# Patient Record
Sex: Female | Born: 1980 | Race: White | Hispanic: No | Marital: Married | Smoking: Never smoker
Health system: Southern US, Community
[De-identification: ages and names within clinical notes are randomized; demographics above are authoritative.]

## PROBLEM LIST (undated history)

## (undated) DIAGNOSIS — T839XXA Unspecified complication of genitourinary prosthetic device, implant and graft, initial encounter: Secondary | ICD-10-CM

## (undated) HISTORY — PX: OTHER SURGICAL HISTORY: SHX169

## (undated) HISTORY — PX: TYMPANOSTOMY TUBE PLACEMENT: SHX32

## (undated) HISTORY — DX: Unspecified complication of genitourinary prosthetic device, implant and graft, initial encounter: T83.9XXA

## (undated) HISTORY — PX: TONSILLECTOMY: SUR1361

---

## 2004-09-03 ENCOUNTER — Emergency Department: Payer: Self-pay | Admitting: Unknown Physician Specialty

## 2008-12-27 ENCOUNTER — Ambulatory Visit: Payer: Self-pay | Admitting: Obstetrics & Gynecology

## 2009-05-12 ENCOUNTER — Ambulatory Visit: Payer: Self-pay | Admitting: Internal Medicine

## 2010-02-11 ENCOUNTER — Observation Stay: Payer: Self-pay | Admitting: Obstetrics and Gynecology

## 2010-02-28 ENCOUNTER — Inpatient Hospital Stay (HOSPITAL_COMMUNITY)
Admission: AD | Admit: 2010-02-28 | Discharge: 2010-03-03 | Payer: Self-pay | Source: Home / Self Care | Attending: Obstetrics & Gynecology | Admitting: Obstetrics & Gynecology

## 2010-03-23 ENCOUNTER — Encounter: Payer: Self-pay | Admitting: Family Medicine

## 2010-03-23 ENCOUNTER — Ambulatory Visit
Admission: RE | Admit: 2010-03-23 | Discharge: 2010-03-23 | Payer: Self-pay | Source: Home / Self Care | Attending: Family Medicine | Admitting: Family Medicine

## 2010-03-23 LAB — CONVERTED CEMR LAB: Rapid Strep: NEGATIVE

## 2010-04-08 ENCOUNTER — Ambulatory Visit: Admit: 2010-04-08 | Payer: Self-pay | Admitting: Obstetrics & Gynecology

## 2010-04-15 ENCOUNTER — Ambulatory Visit: Payer: Commercial Managed Care - PPO | Admitting: Obstetrics and Gynecology

## 2010-04-15 ENCOUNTER — Ambulatory Visit: Admit: 2010-04-15 | Payer: Self-pay | Admitting: Obstetrics and Gynecology

## 2010-04-16 NOTE — Assessment & Plan Note (Signed)
Summary: COUGH,SORE THROAT AND SINUS DRAINAGE/EVM   Vital Signs:  Patient Profile:   30 Years Old Female CC:      Sore throat with possible strep Height:     65.5 inches Weight:      160 pounds BMI:     26.32 O2 Sat:      99 % O2 treatment:    Room Air Temp:     97.5 degrees F oral Pulse rate:   92 / minute Pulse rhythm:   regular Resp:     18 per minute BP sitting:   120 / 78  (right arm)  Pt. in pain?   yes    Location:   neck    Intensity:   4    Type:       aching  Vitals Entered By: Levonne Spiller EMT-P (March 23, 2010 2:37 PM)              Is Patient Diabetic? No  Does patient need assistance? Functional Status Self care Ambulation Normal      Current Allergies: No known allergies History of Present Illness Chief Complaint: Sore throat with possible strep History of Present Illness: This patient is presenting today because she's had 2 weeks of sore throat and cough and congestion. She's had a lot of sinus drainage and reports that she has noticed a purulent discharge on her tonsils. She says she's been coughing a lot at night. Her cough seems to be worse at night. She's had a low-grade fever as well. No nausea or vomiting reported. She reports that she's had a frequent runny nose and sore throat. She reports that she has been trying to breast-feed but it has been unsuccessful and so she continues to try but she has not been able to produce enough milk to sustain the incident. She reports that she is going to pump the breast milk and dump it out while she's taking the medications. She reports that her worse symptom has been the cough and congestion that she has been having throughout the day. It seems to get worse at night.  REVIEW OF SYSTEMS Constitutional Symptoms      Denies fever, chills, night sweats, weight loss, weight gain, and fatigue.  Eyes       Denies change in vision, eye pain, eye discharge, glasses, contact lenses, and eye  surgery. Ear/Nose/Throat/Mouth       Complains of frequent runny nose, sinus problems, sore throat, and hoarseness.      Denies hearing loss/aids, change in hearing, ear pain, ear discharge, dizziness, frequent nose bleeds, and tooth pain or bleeding.  Respiratory       Complains of productive cough.      Denies dry cough, wheezing, shortness of breath, asthma, bronchitis, and emphysema/COPD.      Comments: Colored Sputum Cardiovascular       Denies murmurs, chest pain, and tires easily with exhertion.    Gastrointestinal       Denies stomach pain, nausea/vomiting, diarrhea, constipation, blood in bowel movements, and indigestion. Genitourniary       Denies painful urination, blood or discharge from vagina, kidney stones, and loss of urinary control. Neurological       Denies paralysis, seizures, and fainting/blackouts. Musculoskeletal       Denies muscle pain, joint pain, joint stiffness, decreased range of motion, redness, swelling, muscle weakness, and gout.  Skin       Denies bruising, unusual mles/lumps or sores, and hair/skin or nail changes.  Psych       Denies mood changes, temper/anger issues, anxiety/stress, speech problems, depression, and sleep problems.  Past History:  Family History: Last updated: 03/23/2010 healthy per patient  Social History: Last updated: 03/23/2010 the patient reports that she just delivered a child and reports that she does not use tobacco alcohol or recreational drugs.  Past Medical History: Unremarkable  Past Surgical History: eardrum surgery on both the ears and tonsillectomy  Family History: healthy per patient  Social History: the patient reports that she just delivered a child and reports that she does not use tobacco alcohol or recreational drugs. Physical Exam General appearance: well developed, well nourished, no acute distress Head: normocephalic, atraumatic Eyes: conjunctivae and lids normal Pupils: equal, round, reactive to  light Ears: normal, no lesions or deformities Nasal: swollen red turbinates with congestion, thick yellow congestion and drainage Oral/Pharynx: tongue normal, posterior pharynx with erythema and yellow exudate, postnasal drainage Neck: neck supple,  trachea midline, no masses Chest/Lungs: no rales, wheezes, or rhonchi bilateral, breath sounds equal without effort Heart: regular rate and  rhythm, no murmur Abdomen: soft, non-tender without obvious organomegaly Extremities: normal extremities Neurological: grossly intact and non-focal Skin: no obvious rashes or lesions MSE: oriented to time, place, and person Assessment Problems:   New Problems: ACUTE PHARYNGITIS (ICD-462) ACUTE SINUSITIS, UNSPECIFIED (ICD-461.9) ALLERGIC RHINITIS CAUSE UNSPECIFIED (ICD-477.9) SUPPRESSED LACTATION POSTPARTUM COND/COMP (ZOX-096.04)   Patient Education: Patient and/or caregiver instructed in the following: rest, fluids, Ibuprofen prn. The risks, benefits and possible side effects were clearly explained and discussed with the patient.  The patient verbalized clear understanding.  The patient was given instructions to return if symptoms don't improve, worsen or new changes develop.  If it is not during clinic hours and the patient cannot get back to this clinic then the patient was told to seek medical care at an available urgent care or emergency department.  The patient verbalized understanding.   Demonstrates willingness to comply.  Plan New Medications/Changes: AMOXICILLIN 875 MG TABS (AMOXICILLIN) take 1 by mouth two times a day until completed  #20 x 0, 03/23/2010, Clanford Johnson MD GUIATUSS AC 100-10 MG/5ML SYRP (GUAIFENESIN-CODEINE) take 1 teaspoon by mouth every 4-6 hours as needed cough, congestion: don't breastfeed while taking this  #60 mL x 0, 03/23/2010, Clanford Johnson MD  Planning Comments:   Pt is going to pump her milk and dump it out while she is taking these medications.  She said she  wanted to do this to protect the newborn.   Go to the pharmacy and pick up your prescription (s).  It may take up to 30 mins for electronic prescriptions to be delivered to the pharmacy.  Please call if your pharmacy has not received your prescriptions after 30 minutes.     Follow Up: Follow up in 2-3 days if no improvement, Follow up on an as needed basis, Follow up with Primary Physician  The patient and/or caregiver has been counseled thoroughly with regard to medications prescribed including dosage, schedule, interactions, rationale for use, and possible side effects and they verbalize understanding.  Diagnoses and expected course of recovery discussed and will return if not improved as expected or if the condition worsens. Patient and/or caregiver verbalized understanding.  Prescriptions: AMOXICILLIN 875 MG TABS (AMOXICILLIN) take 1 by mouth two times a day until completed  #20 x 0   Entered and Authorized by:   Standley Dakins MD   Signed by:   Standley Dakins MD on 03/23/2010   Method  used:   Electronically to        [facility]. # [phone]* (retail)       [address]       [place], [place]  [address]       Ph: [phone]       Fax: [phone]   RxID:   [id number] GUIATUSS AC 100-10 MG/5ML SYRP (GUAIFENESIN-CODEINE) take 1 teaspoon by mouth every 4-6 hours as needed cough, congestion: don't breastfeed while taking this  #60 mL x 0   Entered and Authorized by:   Standley Dakins MD   Signed by:   Standley Dakins MD on 03/23/2010   Method used:   Print then Give to Patient   RxID:   [id number]   Lab Results    R. Strep:    Neg   Patient Instructions: 1)  Oral Rehydration Solution: drink 1/2 ounce every 15 minutes. If tolerated afert 1 hour, drink 1 ounce every 15 minutes. As you can tolerate, keep adding 1/2 ounce every 15 minutes, up to a total of 2-4 ounces. Contact the office if unable to tolerate oral solution, if you keep vomiting, or you  continue to have signs of dehydration. 2)  Take your antibiotic as prescribed until ALL of it is gone, but stop if you develop a rash or swelling and contact our office as soon as possible. 3)  Do not breastfeed your infant while taking these medications.  Pump and dump the milk so that you can continue to produce milk after you have completed the treatment.  4)  Return or go to the ER if no improvement or symptoms getting worse.   5)  The patient was informed that there is no on-call provider or services available at this clinic during off-hours (when the clinic is closed).  If the patient developed a problem or concern that required immediate attention, the patient was advised to go the the nearest available urgent care or emergency department for medical care.  The patient verbalized understanding.

## 2010-04-18 ENCOUNTER — Encounter: Payer: Self-pay | Admitting: Family Medicine

## 2010-05-25 ENCOUNTER — Other Ambulatory Visit (HOSPITAL_COMMUNITY)
Admission: RE | Admit: 2010-05-25 | Discharge: 2010-05-25 | Disposition: A | Discharge status: Home / Self Care | Payer: Commercial Managed Care - PPO | Source: Ambulatory Visit | Attending: Family Medicine | Admitting: Family Medicine

## 2010-05-25 ENCOUNTER — Other Ambulatory Visit: Payer: Self-pay | Admitting: Family Medicine

## 2010-05-25 ENCOUNTER — Ambulatory Visit (INDEPENDENT_AMBULATORY_CARE_PROVIDER_SITE_OTHER): Payer: Commercial Managed Care - PPO | Admitting: Family Medicine

## 2010-05-25 ENCOUNTER — Encounter: Payer: Self-pay | Admitting: Family Medicine

## 2010-05-25 DIAGNOSIS — Z124 Encounter for screening for malignant neoplasm of cervix: Secondary | ICD-10-CM | POA: Insufficient documentation

## 2010-05-25 DIAGNOSIS — Z1159 Encounter for screening for other viral diseases: Secondary | ICD-10-CM | POA: Insufficient documentation

## 2010-05-25 DIAGNOSIS — IMO0002 Reserved for concepts with insufficient information to code with codable children: Secondary | ICD-10-CM | POA: Insufficient documentation

## 2010-05-25 DIAGNOSIS — O99345 Other mental disorders complicating the puerperium: Secondary | ICD-10-CM

## 2010-05-25 DIAGNOSIS — Z0000 Encounter for general adult medical examination without abnormal findings: Secondary | ICD-10-CM

## 2010-05-25 LAB — CBC
HCT: 39.1 % (ref 36.0–46.0)
MCH: 31.9 pg (ref 26.0–34.0)
MCH: 32 pg (ref 26.0–34.0)
MCHC: 34.6 g/dL (ref 30.0–36.0)
MCHC: 35 g/dL (ref 30.0–36.0)
MCV: 91.4 fL (ref 78.0–100.0)
MCV: 92.2 fL (ref 78.0–100.0)
Platelets: 103 10*3/uL — ABNORMAL LOW (ref 150–400)
RDW: 13.4 % (ref 11.5–15.5)
RDW: 13.5 % (ref 11.5–15.5)
WBC: 15.9 10*3/uL — ABNORMAL HIGH (ref 4.0–10.5)

## 2010-05-29 ENCOUNTER — Encounter: Payer: Self-pay | Admitting: Family Medicine

## 2010-06-02 ENCOUNTER — Ambulatory Visit: Payer: Self-pay | Admitting: Family Medicine

## 2010-06-02 NOTE — Assessment & Plan Note (Signed)
Summary: NEW PATIENT EST - Patient would like well visit and Physical ...   Vital Signs:  Patient profile:   30 year old female Height:      65.5 inches Weight:      168.50 pounds BMI:     27.71 Temp:     98.1 degrees F oral Pulse rate:   80 / minute Pulse rhythm:   regular BP sitting:   110 / 70  (left arm) Cuff size:   regular  Vitals Entered By: Linde Gillis CMA Duncan Dull) (May 25, 2010 10:41 AM) CC: new patient, physicial with pap   History of Present Illness: 30 yo here to establish care and for CPX.  G3P2, 10 weeks post partum. Doing well but for past several weeks does feel herself getting anxious and depressed at times. No thoughts of harming herself or her children. Just feels anxious, going back to work and juggling mother hood along with working full time. Tearful at times, sometimes sad but overall she feels she is in good spirits. No panic attacks but does feel anxious when she thinks about all she has to take care of at home and at work.  Wants IUD. No h/o abnormal pap smears or STDs.  Current Medications (verified): 1)  Prenatal Vitimin .Marland Kitchen.. 1 Tab Per Day 2)  Breast Milk Suppl .... 2 Caps Once Per Day 3)  Ibuprofen 800 Mg Tabs (Ibuprofen) .... As Needed 4)  Guiatuss Ac 100-10 Mg/23ml Syrp (Guaifenesin-Codeine) .... Take 1 Teaspoon By Mouth Every 4-6 Hours As Needed Cough, Congestion: Don't Breastfeed While Taking This 5)  Amoxicillin 875 Mg Tabs (Amoxicillin) .... Take 1 By Mouth Two Times A Day Until Completed 6)  Prozac 20 Mg Caps (Fluoxetine Hcl) .Marland Kitchen.. 1 Cap By Mouth Daily.  Allergies (verified): No Known Drug Allergies  Past History:  Past Surgical History: Last updated: 03/23/2010 eardrum surgery on both the ears and tonsillectomy  Family History: Last updated: 03/23/2010 healthy per patient  Social History: Last updated: 05/25/2010  Married 2 boys CMA  Past Medical History: G3P2  Social History:  Married 2 boys CMA  Review of  Systems      See HPI General:  Denies malaise. Eyes:  Denies blurring. ENT:  Denies difficulty swallowing. CV:  Denies chest pain or discomfort. Resp:  Denies shortness of breath. GI:  Denies abdominal pain and nausea. GU:  Denies abnormal vaginal bleeding and dysuria. MS:  Denies joint pain, joint redness, and joint swelling. Derm:  Denies rash. Neuro:  Denies headaches. Psych:  Complains of anxiety, depression, easily tearful, and irritability; denies easily angered, panic attacks, sense of great danger, suicidal thoughts/plans, thoughts of violence, unusual visions or sounds, and thoughts /plans of harming others. Endo:  Denies cold intolerance and heat intolerance. Heme:  Denies abnormal bruising and bleeding.  Physical Exam  General:  well developed, well nourished, no acute distress Head:  normocephalic, atraumatic, and no abnormalities observed.   Eyes:  vision grossly intact, pupils equal, pupils round, and pupils reactive to light.   Ears:  R ear normal and L ear normal.   Nose:  no external deformity.   Mouth:  good dentition.   Neck:  No deformities, masses, or tenderness noted. Breasts:  No mass, nodules, thickening, tenderness, bulging, retraction, inflamation, nipple discharge or skin changes noted.   Lungs:  Normal respiratory effort, chest expands symmetrically. Lungs are clear to auscultation, no crackles or wheezes. Heart:  Normal rate and regular rhythm. S1 and S2 normal without gallop,  murmur, click, rub or other extra sounds. Abdomen:  Bowel sounds positive,abdomen soft and non-tender without masses, organomegaly or hernias noted. Rectal:  no external abnormalities.   Genitalia:  Pelvic Exam:        External: normal female genitalia without lesions or masses        Vagina: normal without lesions or masses        Cervix: normal without lesions or masses        Adnexa: normal bimanual exam without masses or fullness        Uterus: normal by palpation        Pap  smear: performed Msk:  No deformity or scoliosis noted of thoracic or lumbar spine.   Extremities:  No clubbing, cyanosis, edema, or deformity noted with normal full range of motion of all joints.   Neurologic:  alert & oriented X3 and gait normal.   Skin:  Intact without suspicious lesions or rashes Axillary Nodes:  No palpable lymphadenopathy Psych:  Cognition and judgment appear intact. Alert and cooperative with normal attention span and concentration. No apparent delusions, illusions, hallucinations   Impression & Recommendations:  Problem # 1:  Preventive Health Care (ICD-V70.0) Reviewed preventive care protocols, scheduled due services, and updated immunizations Discussed nutrition, exercise, diet, and healthy lifestyle.  Pap smear today, pt had lab work done through wellness program at work and will bring a copy to [place].  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 Will start Prozac 20 mg daily, follow up in one month.  Complete Medication List: 1)  Prenatal Vitimin  .Marland Kitchen.. 1 tab per day 2)  Breast Milk Suppl  .... 2 caps once per day 3)  Ibuprofen 800 Mg Tabs (Ibuprofen) .... As needed 4)  Guiatuss Ac 100-10 Mg/29ml Syrp (Guaifenesin-codeine) .... Take 1 teaspoon by mouth every 4-6 hours as needed cough, congestion: don't breastfeed while taking this 5)  Amoxicillin 875 Mg Tabs (Amoxicillin) .... Take 1 by mouth two times a day until completed 6)  Prozac 20 Mg Caps (Fluoxetine hcl) .Marland Kitchen.. 1 cap by mouth daily.  Patient Instructions: 1)  Great to meet you. 2)  Please follow up with me in 1 month by phone or appointment to let me know how the medication is working. Prescriptions: PROZAC 20 MG CAPS (FLUOXETINE HCL) 1 cap by mouth daily.  #30 x 1   Entered and Authorized by:   Ruthe Mannan MD   Signed by:   Ruthe Mannan MD on 05/25/2010   Method used:   Print then Give to Patient   RxID:    [id number]    Orders Added: 1)  New Patient 18-39 years [99385] 2)  New Patient Level II [99202]    Prior Medications (reviewed today): None Current Allergies (reviewed today): No known allergies      Past Medical History:    G3P2

## 2010-06-02 NOTE — Letter (Signed)
Summary: Results Follow up Letter  [place] [facility]  [address] [place], [place] [address]   Phone: [phone]  Fax: [phone]    05/29/2010 MRN: [id number]  Evelyn Thomas [address] [place], [place]  [address]  [place]  Dear Ms. Patron,  The following are the results of your recent test(s):  Test         Result    Pap Smear:        Normal __X___  Not Normal _____ Comments: ______________________________________________________ Cholesterol: LDL(Bad cholesterol):         Your goal is less than:         HDL (Good cholesterol):       Your goal is more than: Comments:  ______________________________________________________ Mammogram:        Normal _____  Not Normal _____ Comments:  ___________________________________________________________________ Hemoccult:        Normal _____  Not normal _______ Comments:    _____________________________________________________________________ Other Tests:    We routinely do not discuss normal results over the telephone.  If you desire a copy of the results, or you have any questions about this information we can discuss them at your next office visit.   Sincerely,      Dr. Ruthe Mannan

## 2010-06-05 NOTE — Progress Notes (Signed)
NAME:  Evelyn Thomas, Evelyn Thomas NO.:  [id number]  MEDICAL RECORD NO.:  [id number]           PATIENT TYPE:  A  LOCATION:  [facility]                   FACILITY:  [facility]  PHYSICIAN:  Scheryl Darter, MD       DATE OF BIRTH:  [birth year]  DATE OF SERVICE:                                 CLINIC NOTE  The patient comes today postpartum from a vaginal delivery on March 01, 2010.  The patient has had prenatal care at [facility] in [place], but she needs to switch her care at the end of the pregnancy due to insurance issues.  She presented with rupture of membranes on March 01, 2010.  The patient is a 30 year old white female gravida 3, para 2-0-1-2.  She was about 39 weeks when she presented.  She developed maternal fever prior to delivery.  There was shoulder dystocia which was relieved without any fetal injury.  Infant is doing well now.  Infant was kept in nursery for about a week after delivery.  She is now bottle feeding.  Her husband is going to have a vasectomy.  Her postpartum depression scale is 6.  She has no medical problems.  REVIEW OF SYSTEMS:  Positive for some low back pain.  PHYSICAL EXAMINATION:  GENERAL:  Patient in no acute distress. VITAL SIGNS:  Her weight is 164 pounds, blood pressure 98/68, temperature 98.6. ABDOMEN:  Soft, nontender.  No mass. EXTREMITIES:  Show no swelling. PELVIC:  External genitalia, vagina and cervix appeared normal.  Uterus normal size, nontender.  No mass.  IMPRESSION:  The patient is doing well postpartum after a vaginal delivery.  She discussed at length the course of her labor.  She had some dissatisfaction because of development of fever during labor.  She plans to see the primary care physician at [facility].  I recommended that if she wished to change to another gynecologist, she can go to the center for [employer] at [facility].  She should use backup method of contraception until her husband  has had his vasectomy and that has been confirmed to be effective.  She might expect to get her periods in the next month or two.     Scheryl Darter, MD    JA/MEDQ  D:  04/15/2010  T:  04/16/2010  Job:  [id number]

## 2010-06-08 ENCOUNTER — Telehealth: Payer: Self-pay | Admitting: Family Medicine

## 2010-06-08 MED ORDER — FLUOXETINE HCL 40 MG PO CAPS: 40.0000 mg | ORAL_CAPSULE | Freq: Every day | ORAL | Status: DC

## 2010-06-08 MED ORDER — FLUOXETINE HCL 20 MG PO CAPS: 20.0000 mg | ORAL_CAPSULE | Freq: Every day | ORAL | Status: DC

## 2010-06-08 NOTE — Telephone Encounter (Signed)
Rx called to pharmacy, patient notified. 

## 2010-06-08 NOTE — Telephone Encounter (Signed)
Patient advised as instructed via telephone. 

## 2010-06-08 NOTE — Telephone Encounter (Signed)
Patient has a cyst on one of her ovaries and wants it removed.  She wants to know where Dr. Dayton Martes recommends she go to get it removed.  She wants to go to someone with the [facility] system.  Please advise.

## 2010-06-08 NOTE — Telephone Encounter (Signed)
Patient stated that she has been on Prozac 20mg  now for two weeks and can not tell any difference.  Can she increase the dosage?  She stated that Dr. Dayton Martes mentioned that she could go up on the dosage if the 20mg  doesn't work when she was here at her new patient visit.

## 2010-06-08 NOTE — Telephone Encounter (Addendum)
The women[facility] clinic across the street is very good and within the [facility]. Yes she can increase dose to 40 mg daily.

## 2010-06-08 NOTE — Telephone Encounter (Signed)
Patient request increase from Prozac 20mg , not a refill.  Please advise.

## 2010-06-22 ENCOUNTER — Ambulatory Visit: Payer: Commercial Managed Care - PPO | Admitting: Family Medicine

## 2010-06-23 ENCOUNTER — Ambulatory Visit: Payer: Commercial Managed Care - PPO | Admitting: Family Medicine

## 2010-06-26 ENCOUNTER — Encounter: Payer: Self-pay | Admitting: Family Medicine

## 2010-06-26 ENCOUNTER — Ambulatory Visit: Payer: Commercial Managed Care - PPO | Admitting: Family Medicine

## 2010-06-26 ENCOUNTER — Ambulatory Visit (INDEPENDENT_AMBULATORY_CARE_PROVIDER_SITE_OTHER): Payer: Commercial Managed Care - PPO | Admitting: Family Medicine

## 2010-06-26 VITALS — BP 120/80 | HR 80 | Temp 98.7°F | Ht 65.0 in | Wt 166.1 lb

## 2010-06-26 DIAGNOSIS — Z30430 Encounter for insertion of intrauterine contraceptive device: Secondary | ICD-10-CM

## 2010-06-26 NOTE — Progress Notes (Signed)
30 yo G3P1 here for Mirena IUD insertion.  Had a mirena prior to conceiving her son.  No issues with it.  No side effects. Pt is a non smoker. No h/o STDs.  The PMH, PSH, Social History, Family History, Medications, and allergies have been reviewed in [facility], and have been updated if relevant.  ROS: General: Denies fever, chills, sweats. No significant weight loss. GI: Denies nausea, vomiting, diarrhea, constipation, change in bowel habits, abdominal pain, melena, hematochezia GU: Denies dysuria, hematuria, urinary hesitancy, nocturia, denies STD risk, no concerns about discharge  Physical exam: BP 120/80  Pulse 80  Temp(Src) 98.7 F (37.1 C) (Oral)  Ht 5\' 5"  (1.651 m)  Wt 166 lb 1.9 oz (75.352 kg)  BMI 27.64 kg/m2  LMP 06/18/2009  Gen:  Alert, pleasant female, NAD GU: Normal introitus for age, no external lesions, no vaginal discharge, mucosa pink and moist, no vaginal or cervical lesions, no vaginal atrophy, no friaility or hemorrhage, normal uterus size and position, no adnexal masses or tenderness.  Chaperoned exam.      IUD INSERTION: Patient given informed consent, signed copy in the chart..  Negative pregnancy confirmed .Appropriate time out taken Sterile instruments and technique used. Cervix brought into view with use of speculum and then cleansed three times with a betadine swab. A tenaculum was placed into the anterior lip of the cervix. A uterine sound was introduced into the uterine cavity and then removed.   A mirena IUD was placed into the endometrial cavity, deployed and secured. The applicator was removed. The strings were trimmed to 2 inches.   There were no complications and the patient tolerated the procedure well.   She was able to identify the IUD strings post procedure. She was given handouts for post procedure instructions and information about the IUD including an card with the time of recommended removal.

## 2010-06-30 ENCOUNTER — Ambulatory Visit: Payer: Commercial Managed Care - PPO | Admitting: Family Medicine

## 2010-07-02 ENCOUNTER — Encounter: Payer: Self-pay | Admitting: Family Medicine

## 2010-07-08 ENCOUNTER — Encounter: Payer: Self-pay | Admitting: Family Medicine

## 2010-07-09 ENCOUNTER — Telehealth: Payer: Self-pay | Admitting: *Deleted

## 2010-07-09 NOTE — Telephone Encounter (Signed)
Unfortunately, I do not call in abx without evaluating patients.

## 2010-07-09 NOTE — Telephone Encounter (Signed)
Patient called and stated that she thinks she has a sinus infection, been dealing with the pain and pressure/drainage for about 5 days now.  Wanted to know if Dr. Dayton Martes would call her in a Zpak or other antibiotic to [facility].  She has NKDA.  Please advise.

## 2010-07-09 NOTE — Telephone Encounter (Signed)
Patient advised as instructed via telephone.  She stated that her IUD is in place and she is doing fine.  She did start her cycle today but she doesn't think that it will last long.

## 2010-08-03 ENCOUNTER — Telehealth: Payer: Self-pay | Admitting: *Deleted

## 2010-08-03 DIAGNOSIS — R102 Pelvic and perineal pain: Secondary | ICD-10-CM

## 2010-08-03 NOTE — Telephone Encounter (Signed)
It's not uncommon to still have periods when you first get an IUD. Hopefully, they will disappear with time. In terms of the abdominal pain, we should go ahead and get a pelvic/abdominal ultrasound. Let me know and I will place the order.

## 2010-08-03 NOTE — Telephone Encounter (Signed)
Patient called to let Dr. Dayton Martes know that she still having tenderness in her lower abdomen that she mentioned to Dr. Dayton Martes before.  Also, since getting the Mirena her cycles have been coming a week earlier than usual.  She stated that she did not have a cycle five years ago with the Mirena.  She wanted to know what could be done or what the next step will be as far as her abdominal pain.

## 2010-08-04 ENCOUNTER — Telehealth: Payer: Self-pay | Admitting: *Deleted

## 2010-08-04 NOTE — Telephone Encounter (Signed)
Opened in error

## 2010-08-04 NOTE — Telephone Encounter (Signed)
Patient would like to have pelvic/abdominal ultrasound done.  She prefers appointments on Wednesday afternoons if possible because she gets off work at 1:00.

## 2010-08-06 ENCOUNTER — Other Ambulatory Visit: Payer: Self-pay | Admitting: Family Medicine

## 2010-08-06 DIAGNOSIS — R102 Pelvic and perineal pain: Secondary | ICD-10-CM

## 2010-08-07 NOTE — Telephone Encounter (Signed)
ABD/US and Trans/Pelvic US on 08/19/2010 at 7:15am and 9:15am.MK

## 2010-08-12 ENCOUNTER — Other Ambulatory Visit: Payer: Commercial Managed Care - PPO

## 2010-08-12 ENCOUNTER — Inpatient Hospital Stay: Admission: RE | Admit: 2010-08-12 | Payer: Commercial Managed Care - PPO | Source: Ambulatory Visit

## 2010-08-19 ENCOUNTER — Other Ambulatory Visit: Payer: Self-pay | Admitting: Family Medicine

## 2010-08-19 ENCOUNTER — Other Ambulatory Visit: Payer: Commercial Managed Care - PPO

## 2010-08-19 ENCOUNTER — Other Ambulatory Visit (HOSPITAL_COMMUNITY): Payer: Commercial Managed Care - PPO

## 2010-08-19 ENCOUNTER — Ambulatory Visit (HOSPITAL_COMMUNITY)
Admission: RE | Admit: 2010-08-19 | Discharge: 2010-08-19 | Disposition: A | Discharge status: Home / Self Care | Payer: Commercial Managed Care - PPO | Source: Ambulatory Visit | Attending: Family Medicine | Admitting: Family Medicine

## 2010-08-19 ENCOUNTER — Telehealth: Payer: Self-pay | Admitting: *Deleted

## 2010-08-19 DIAGNOSIS — N83209 Unspecified ovarian cyst, unspecified side: Secondary | ICD-10-CM

## 2010-08-19 DIAGNOSIS — R102 Pelvic and perineal pain: Secondary | ICD-10-CM

## 2010-08-19 DIAGNOSIS — Z975 Presence of (intrauterine) contraceptive device: Secondary | ICD-10-CM | POA: Insufficient documentation

## 2010-08-19 DIAGNOSIS — R109 Unspecified abdominal pain: Secondary | ICD-10-CM | POA: Insufficient documentation

## 2010-08-19 NOTE — Telephone Encounter (Signed)
Patient advised as instructed via telephone.  She wants to go ahead and see a specialist regarding the cyst.  She has been seen at [facility] but does not want to go back there.  Please advise.

## 2010-08-19 NOTE — Telephone Encounter (Signed)
Yes i have reviewed and wrote my remarks on result note.

## 2010-08-19 NOTE — Telephone Encounter (Signed)
Ok to just follow the cyst, as typically does not require removal but I will place gyn referral if pt prefers.  Referral placed.

## 2010-08-19 NOTE — Telephone Encounter (Signed)
Pt had pelvic ultrasound this morning and is asking if you can watch out for her results, she would like the results today if possible.

## 2010-08-21 ENCOUNTER — Telehealth: Payer: Self-pay | Admitting: Family Medicine

## 2010-08-21 NOTE — Telephone Encounter (Signed)
I don't know how to cancel and order already entered but it is fine to cancel it.

## 2010-08-21 NOTE — Telephone Encounter (Signed)
Pt is scheduled for a Gyn appointment / Dr. Marice Potter for June 18th at the Ctr for [facility]. Pt does not want you to order the follow up U/S as she will have this done w/ her new Gyn physician. Please cancel order for future pelvis u/s. 08-21-2010

## 2010-09-03 ENCOUNTER — Encounter: Payer: Commercial Managed Care - PPO | Admitting: Obstetrics & Gynecology

## 2010-09-07 ENCOUNTER — Other Ambulatory Visit: Payer: Self-pay | Admitting: *Deleted

## 2010-09-07 MED ORDER — IBUPROFEN 800 MG PO TABS: 800.0000 mg | ORAL_TABLET | ORAL | Status: DC | PRN

## 2010-09-07 NOTE — Telephone Encounter (Signed)
Pharmacy called wanting a quantity that pt can take, per Dr Dayton Martes advised one 3 times a day as needed.

## 2010-09-07 NOTE — Telephone Encounter (Signed)
Patient called to request Rx refill.  She stated that she forgot to ask Dr. Dayton Martes for a refill at her last office visit.  Please advise.  Uses [facility].

## 2010-09-07 NOTE — Telephone Encounter (Signed)
Message left on cell phone voicemail advising patient as instructed.  Rx called to [facility].

## 2010-09-10 ENCOUNTER — Telehealth: Payer: Self-pay | Admitting: *Deleted

## 2010-09-10 ENCOUNTER — Encounter (INDEPENDENT_AMBULATORY_CARE_PROVIDER_SITE_OTHER): Payer: Commercial Managed Care - PPO | Admitting: Obstetrics & Gynecology

## 2010-09-10 DIAGNOSIS — N83209 Unspecified ovarian cyst, unspecified side: Secondary | ICD-10-CM

## 2010-09-10 DIAGNOSIS — N949 Unspecified condition associated with female genital organs and menstrual cycle: Secondary | ICD-10-CM

## 2010-09-10 NOTE — Telephone Encounter (Signed)
I am sorry that she had an unpleasant experience. I will place another referral.

## 2010-09-10 NOTE — Telephone Encounter (Signed)
Patient advised as instructed via telephone.  She stated that she has spoken with Aram Beecham.

## 2010-09-10 NOTE — Telephone Encounter (Signed)
Patient called to let Dr. Dayton Martes know that she saw Dr. Marice Potter this morning at the Center for [facility] across the street.  She stated that she was very unhappy with her visit there.  She arrived 15 minutes early to check in and no one was at the front desk to check her in.  Finally after someone came to the desk, she waited for another 30-45 minutes before they called her back into a room.  When Dr. Marice Potter came in she felt that she rushed through the visit and didn't really listen to her questions or concerns.  She doesn't want to go back there and wants Dr. Dayton Martes to referral her to someone else.  Please advise.

## 2010-09-11 NOTE — Assessment & Plan Note (Signed)
NAME:  Evelyn Thomas, Evelyn Thomas NO.:  [id number]  MEDICAL RECORD NO.:  [id number]           PATIENT TYPE:  LOCATION:  [facility] at [facility]           FACILITY:  PHYSICIAN:  Allie Bossier, MD        DATE OF BIRTH:  1980/12/31  DATE OF SERVICE:  09/09/2010                                 CLINIC NOTE  Evelyn Thomas is a 30 year old, married, white, G3, P2, A1.  She has a 59- month-old child and an 71-year-old child.  She was seen by Dr. Ruthe Mannan recently for her annual exam.  At that time, she complained of midline pelvic pain.  She says this pain has been present since 2007, is not on a daily basis, it "comes and goes."  She does report some urinary urgency and some increase number of bowel movement since delivery.  She reports some dyspareunia which she attributes to her IUD (I do not necessarily agree with this).  She tells me that she had in 2007 a right ovarian cyst was noted and now she has a left ovarian cyst that measures 4.3 cm, it is definitely a simple cyst and the ultrasound was done recently.  However, she says that this cyst has been present since during her pregnancy.  I do not have access to those records at this moment.  PHYSICAL EXAM:  Her uterus is normal size and shape, anteverted, mobile, nontender.  Adnexa nontender.  She does have a fullness in her left adnexa.  ASSESSMENT AND PLAN: 1. Midline pelvic pain for several years duration and increased     urinary urgency and frequency. 2. Simple cyst on the left ovary with a history of a simple cyst on     the right ovary.  I have explained to Evelyn Thomas that in an     ovulating woman cyst frequently occur, they generally resolve,     however, I will obtain the ultrasound records from during her     pregnancy and if the cyst on the left was present for now many     months, I would offer her a laparoscopy.  Please note, she came in     today with the anticipation of having her ovarian cyst removed.  I     have  also told her that I do not think this is at all a malignancy     and I do not think it is related to her midline pain.  I am     suspicious for interstitial cystitis is a cause of her midline     pain.  Therefore, I am checking urine culture, urinalysis, and I am     recommending a Urology consult.  I am also checking cervical     cultures just to be thorough, although, I doubt their positive.  I     have her back in 2-4 weeks to go over her previous ultrasounds and     schedule a laparoscopic left ovarian cystectomy/cystotomy if she     chooses.     Allie Bossier, MD    MCD/MEDQ  D:  09/10/2010  T:  09/10/2010  Job:  [id number]

## 2010-09-17 ENCOUNTER — Telehealth: Payer: Self-pay | Admitting: *Deleted

## 2010-09-17 NOTE — Telephone Encounter (Signed)
This was already placed last time we talked to her (end of June) so we would need to check with Aram Beecham about the status of referral.

## 2010-09-17 NOTE — Telephone Encounter (Signed)
Sometimes this can happen with IUDs. I am very reassured that we confirmed her IUD is in place on ultrasound. There is not anything else I can do at this point and will have to defer to OBGYN.

## 2010-09-17 NOTE — Telephone Encounter (Signed)
Patient called stating that she has been spotting every since her menstrual cycle went off a few days ago.  She stated that she is a little concerned and wants to know what she should do.  She didn't have a cycle with the last IUD she had but now she states that she has a cycle and it last longer than they did before.  Please advise.

## 2010-09-17 NOTE — Telephone Encounter (Signed)
I will check with Aram Beecham.

## 2010-09-17 NOTE — Telephone Encounter (Signed)
Spoke with patient and advised her as instructed.  She stated that she would like a OBGYN referral.  She has spoken to [place] before and she mentioned a Dr. Michel Santee at [facility].  [place] would like to see someone at [facility] if possibe.  Please advise.

## 2010-09-18 ENCOUNTER — Ambulatory Visit (INDEPENDENT_AMBULATORY_CARE_PROVIDER_SITE_OTHER): Payer: Commercial Managed Care - PPO | Admitting: Gynecology

## 2010-09-18 ENCOUNTER — Telehealth: Payer: Self-pay | Admitting: *Deleted

## 2010-09-18 DIAGNOSIS — N949 Unspecified condition associated with female genital organs and menstrual cycle: Secondary | ICD-10-CM

## 2010-09-18 DIAGNOSIS — K644 Residual hemorrhoidal skin tags: Secondary | ICD-10-CM

## 2010-09-18 DIAGNOSIS — Z30431 Encounter for routine checking of intrauterine contraceptive device: Secondary | ICD-10-CM

## 2010-09-18 NOTE — Telephone Encounter (Signed)
This can happen with IUDs. I'm happy that she is pleased with her care.

## 2010-09-18 NOTE — Telephone Encounter (Signed)
Patient was seen today by GYN and she called to let us know that upon examination the doctor found that her IUD was coming out, her body is rejecting it for some reason and that may be what is causing her pain and/or discomfort.  She has a f/u appt with them in 2 months.  She stated that she was very please with the care she received there today.

## 2010-09-18 NOTE — Telephone Encounter (Signed)
Patient called to let me know that she is still bleeding like she is having another menstrual cycle.  She stated that she is becoming more and more concerned.  She stated that her appt to see the GYN was not until August because she had the appt moved back, but she called today and they are working her in at 3:15 today.  She will call back with an update.

## 2010-09-21 NOTE — Telephone Encounter (Signed)
No at this point I would feel more comfortable with the OBGYN to placing her IUD. Thank you for the update.

## 2010-09-21 NOTE — Telephone Encounter (Signed)
Left message at patients job to have her return call.

## 2010-09-21 NOTE — Telephone Encounter (Signed)
I spoke with patient and she wanted to let Dr. Dayton Martes know that when she say Dr. Katherina Right he found that she has a cyst on her left ovary as well as the cyst she already knew was on the right ovary.  He wants to keep an eye on it for now, he ordered a repeat ultrasound in two months.  She wanted to know if Dr. Dayton Martes could put in another IUD?  She stated that her and her spouse have been discussing him getting a vasectomy but he is unsure right now as to if he is going to get it or not.  Please advise.

## 2010-09-22 ENCOUNTER — Ambulatory Visit: Payer: Commercial Managed Care - PPO | Admitting: Obstetrics & Gynecology

## 2010-09-22 NOTE — Telephone Encounter (Signed)
Left message with coworker to have patient return call. 

## 2010-09-22 NOTE — Telephone Encounter (Signed)
No unfortunately we cannot do that. The IUD was successfully placed, which was verified on ultrasound. One of the risks of placing and IUD is that your body expels it. We cannot cover another one for free.

## 2010-09-22 NOTE — Telephone Encounter (Signed)
Patient advised as instructed via telephone. 

## 2010-09-22 NOTE — Telephone Encounter (Signed)
Patient stated that she had to pay for the IUD that was placed here in our office and since it came out she wanted to know if Dr. Dayton Martes would place another one free of charge.  Please advise.

## 2010-09-24 ENCOUNTER — Ambulatory Visit: Payer: Commercial Managed Care - PPO | Admitting: Gynecology

## 2010-10-01 ENCOUNTER — Telehealth: Payer: Self-pay | Admitting: *Deleted

## 2010-10-01 NOTE — Telephone Encounter (Signed)
Left message with a coworker at patients job to have her return call.

## 2010-10-01 NOTE — Telephone Encounter (Signed)
This would require an office visit 

## 2010-10-01 NOTE — Telephone Encounter (Signed)
Patient called to let Dr. Dayton Martes know that she has been having trouble sleeping and has been for several months now.  She wants Dr. Dayton Martes to prescribe her some medicine that will help her sleep. Please advise.

## 2010-10-01 NOTE — Telephone Encounter (Signed)
Patient advised as instructed via telephone.  She stated that she will try OTC sleep aids first and if that doesn't work she will contact us for an appt.

## 2010-10-09 ENCOUNTER — Telehealth: Payer: Self-pay | Admitting: *Deleted

## 2010-10-09 NOTE — Telephone Encounter (Signed)
SEE CHART NOTE(09/18/10) PT CALLED STATING SHE STILL HAVING CENTRAL PELVIC PAIN. LMP: TODAY  WANTS TO KNOW WHAT HER NEXT STEP SHOULD BE? PLEASE ADVISE.

## 2010-10-09 NOTE — Telephone Encounter (Signed)
CALLED PT TO SCHEDULE ULTRASOUND DUE TO BELOW NOTE,PT TRANSFERRED TO APPOINTMENT DESK.

## 2010-10-09 NOTE — Telephone Encounter (Signed)
I would recommend the patient start with ultrasound to your office visit with me after to discuss and then we'll go from there. My suggestion is probably going to be laparoscopy but we'll wait to see the ultrasound report and my reexamination of the patient

## 2010-10-28 ENCOUNTER — Ambulatory Visit: Payer: Commercial Managed Care - PPO | Admitting: Gynecology

## 2010-12-29 ENCOUNTER — Telehealth: Payer: Self-pay | Admitting: *Deleted

## 2010-12-29 ENCOUNTER — Other Ambulatory Visit: Payer: Self-pay | Admitting: Gynecology

## 2010-12-29 DIAGNOSIS — N83209 Unspecified ovarian cyst, unspecified side: Secondary | ICD-10-CM

## 2010-12-29 NOTE — Telephone Encounter (Signed)
Pt schedule for ultrasound tomorrow and her period has a light flow, pt wanted to know if this was okay to still have ultrasound. Pt informed this was okay.

## 2010-12-30 ENCOUNTER — Encounter: Payer: Self-pay | Admitting: Gynecology

## 2010-12-30 ENCOUNTER — Ambulatory Visit: Payer: Commercial Managed Care - PPO | Admitting: Gynecology

## 2010-12-30 ENCOUNTER — Ambulatory Visit (INDEPENDENT_AMBULATORY_CARE_PROVIDER_SITE_OTHER): Payer: 59 | Admitting: Gynecology

## 2010-12-30 ENCOUNTER — Ambulatory Visit: Payer: Commercial Managed Care - PPO

## 2010-12-30 ENCOUNTER — Ambulatory Visit (INDEPENDENT_AMBULATORY_CARE_PROVIDER_SITE_OTHER): Payer: 59

## 2010-12-30 VITALS — BP 110/68

## 2010-12-30 DIAGNOSIS — N949 Unspecified condition associated with female genital organs and menstrual cycle: Secondary | ICD-10-CM

## 2010-12-30 DIAGNOSIS — R102 Pelvic and perineal pain: Secondary | ICD-10-CM

## 2010-12-30 DIAGNOSIS — N83209 Unspecified ovarian cyst, unspecified side: Secondary | ICD-10-CM

## 2010-12-30 NOTE — Progress Notes (Signed)
Patient presents for ultrasound. She was seen in July when she had increased pelvic pain was found to be extruding her IUD this was removed and she had a 4.3 cm left ovarian cyst at that time. She notes her pelvic discomfort has continued. Although she does not have dyspareunia just a generalized discomfort. No urinary symptoms such as frequency dysuria.  Her ultrasound today is normal with right and left ovaries showing physiologic changes. Endometrial echo 8.0 millimeter. Cul-de-sac negative.  Exam Abdomen with small umbilical hernia otherwise soft nontender without masses guarding rebound organomegaly  Assessment and plan: Pelvic discomfort ultrasound is normal. I reviewed possibilities to include endometriosis. She is not having dyspareunia. She's having regular menses. Other possibilities include adhesive disease possible I.C. Although she is not having overt urinary symptoms. Options for management were reviewed.  I think the next step from a gynecologic standpoint would be laparoscopy. Alternative would be to monitor symptoms over the next several months to see if it's still persists. I reviewed what was involved in general with laparoscopy. I discussed with her umbilical hernia that this may complicate things and worsen the hernia. She could also consider having the hernia repaired at the same time as laparoscopy. She has an appointment with her general surgeon beginning of November to follow up in the hernia and she can discuss this with them and then follow up with me with her decision.

## 2010-12-31 ENCOUNTER — Telehealth: Payer: Self-pay | Admitting: *Deleted

## 2010-12-31 NOTE — Telephone Encounter (Signed)
(  From last office visit note) Pt had her appointment with Dr.sankar today regarding her hernia and per pt Dr.Sankar did agree with what you discussed with the pt. But his office is not part of [place], so pt would like another general surgeon to consult with. That way the pt can have two doctors under the [facility] to do her surgery. Please advise

## 2010-12-31 NOTE — Telephone Encounter (Signed)
Pt will be contacted and appointment will be sent up by [facility].

## 2010-12-31 NOTE — Telephone Encounter (Signed)
We can set up an appointment for her to see a general surgeon in reference to umbilical hernia repair at same time as diagnostic laparoscopy. She will see them first and then if she wants to proceed with surgery Olegario Messier will schedule him coordinate a time with them.

## 2011-01-04 ENCOUNTER — Telehealth: Payer: Self-pay

## 2011-01-04 NOTE — Telephone Encounter (Signed)
I left message for patient that I scheduled her an appointment with Dr. Magnus Ivan at [facility]. His first available was Monday, Nov 5 at 8:50am for check-in.  They asked that she bring notes or arrange to have them sent to them from Dr. Evette Cristal who diagnosed her hernia.  I asked patient to call me to confirm that she got this message. ka

## 2011-01-07 ENCOUNTER — Telehealth: Payer: Self-pay

## 2011-01-07 NOTE — Telephone Encounter (Signed)
Patient called to let me know that surgery around Thanksgiving is not going to work out for her.  She is now considering Friday, Dec 21st as a possibility since she is off the following week.  She called [facility] and Dr. Magnus Ivan is not available that day for surgery but Dr. Michaell Cowing is. She r/s her appt. for 01/27/11 and will see Dr. Michaell Cowing then and get his opinion regarding surgery for her hernia.  We will go from there and hopefully coordinate surgery for this day.  I went ahead and called the OR at [facility] and tentatively scheduled her for 7:30am to hold the OR time on the 21st in hopes Dr. Michaell Cowing will be able to work then.  If not perhaps a 1:00pm time might be available and work for him.  At patient's request I checked her ins benefits with [facility] and quoted her surgery prepymt amount so that she can plan financially as well.  She will follow up with me after her visit to the surgeon and we will let Dr. Velvet Bathe know her decision regarding surgery.

## 2011-01-11 ENCOUNTER — Telehealth: Payer: Self-pay

## 2011-01-11 NOTE — Telephone Encounter (Signed)
Patient called to ask me how long she would need to be out of work for [employer] and hernia surgery.  I told her that the Diag Lap would require a few days recovery but Dr. Velvet Bathe would probably support a week out of work with disability co.  I told her I did not know how much time the general surgeon would recommend for the hernia surgery.  Patient began to tell me that she has had a feeling after urination of "throbbing" feeling mid-abdomen and wondered if something is going on with her bladder.  I offered her an office visit to have u/a, exam.  She said to let her consider it and she will call back if she wants to come in.

## 2011-01-18 ENCOUNTER — Ambulatory Visit (INDEPENDENT_AMBULATORY_CARE_PROVIDER_SITE_OTHER): Payer: 59 | Admitting: Surgery

## 2011-01-19 ENCOUNTER — Telehealth: Payer: Self-pay | Admitting: *Deleted

## 2011-01-19 NOTE — Telephone Encounter (Signed)
Pt called stating today her period started early with cramping and some right pelvic pain. She also noted that of some bloody discharge after sex, but it went away. I told pt with all the irregular things going on she should make appointment, pt states she will monitor for now and call back to make office visit if this should continue.

## 2011-01-20 ENCOUNTER — Telehealth: Payer: Self-pay | Admitting: *Deleted

## 2011-01-20 NOTE — Telephone Encounter (Signed)
Pt informed with the below note and will watch for now and follow up if needed.

## 2011-01-20 NOTE — Telephone Encounter (Signed)
If she does not want to proceed with surgery that is her choice. As far as the pain and bleeding I would watch it for now if it persists or recur she needs office visit.

## 2011-01-20 NOTE — Telephone Encounter (Signed)
Pt called on 01/15/11 and said she has decided she cannot do surgery now.  She had me tentatively holding a date until she could check with surgeon.  Because of cost and the holidays she has decided not to do it now.  ka

## 2011-01-20 NOTE — Telephone Encounter (Signed)
Pt called stating her period has started 10 days earlier than usual. (Last office visit on 12/30/10) The bleeding started yesterday not a very heavy flow, but mild. 1 week prior to this she has some bleeding after intercourse. She has cramping and sharp pain at times throbbing right and left pelvic pain. I offered office visit to pt, but she would like you to decide what she should do. Please advise.

## 2011-01-27 ENCOUNTER — Ambulatory Visit (INDEPENDENT_AMBULATORY_CARE_PROVIDER_SITE_OTHER): Payer: 59 | Admitting: Surgery

## 2011-03-02 ENCOUNTER — Other Ambulatory Visit: Payer: Self-pay | Admitting: *Deleted

## 2011-03-02 MED ORDER — IBUPROFEN 800 MG PO TABS: 800.0000 mg | ORAL_TABLET | ORAL | Status: DC | PRN

## 2011-03-02 NOTE — Telephone Encounter (Signed)
Patient advised via telephone, Rx was sent to [facility].

## 2011-03-05 ENCOUNTER — Encounter (HOSPITAL_BASED_OUTPATIENT_CLINIC_OR_DEPARTMENT_OTHER): Admission: RE | Payer: Self-pay | Source: Ambulatory Visit

## 2011-03-05 ENCOUNTER — Ambulatory Visit (HOSPITAL_BASED_OUTPATIENT_CLINIC_OR_DEPARTMENT_OTHER): Admission: RE | Admit: 2011-03-05 | Payer: 59 | Source: Ambulatory Visit | Admitting: Gynecology

## 2011-03-05 SURGERY — LAPAROSCOPY, DIAGNOSTIC
Anesthesia: General

## 2011-03-31 ENCOUNTER — Encounter: Payer: Self-pay | Admitting: Family Medicine

## 2011-03-31 ENCOUNTER — Ambulatory Visit (INDEPENDENT_AMBULATORY_CARE_PROVIDER_SITE_OTHER): Payer: 59 | Admitting: Family Medicine

## 2011-03-31 VITALS — BP 110/80 | HR 76 | Temp 98.6°F

## 2011-03-31 DIAGNOSIS — J069 Acute upper respiratory infection, unspecified: Secondary | ICD-10-CM

## 2011-03-31 DIAGNOSIS — J329 Chronic sinusitis, unspecified: Secondary | ICD-10-CM

## 2011-03-31 MED ORDER — AZITHROMYCIN 250 MG PO TABS: ORAL_TABLET | ORAL | Status: AC

## 2011-03-31 MED ORDER — HYDROCOD POLST-CHLORPHEN POLST 10-8 MG/5ML PO LQCR: 5.0000 mL | Freq: Every evening | ORAL | Status: DC | PRN

## 2011-03-31 NOTE — Patient Instructions (Signed)
Take antibiotic as directed.  Drink lots of fluids.  Treat sympotmatically with Mucinex, nasal saline irrigation, and Tylenol/Ibuprofen. Also try claritin D or zyrtec D over the counter- two times a day as needed ( have to sign for them at pharmacy). You can use warm compresses.  Cough suppressant at night. Call if not improving as expected in 5-7 days.    

## 2011-03-31 NOTE — Progress Notes (Signed)
SUBJECTIVE:  Evelyn Thomas is a 31 y.o. female who complains of coryza, congestion, sore throat and bilateral sinus pain for 12 days. She denies a history of anorexia, chest pain and chills and admits to a history of asthma. Patient denies smoke cigarettes.   Patient Active Problem List  Diagnoses  . POSTPARTUM DEPRESSION, MILD   No past medical history on file. Past Surgical History  Procedure Date  . Tonsillectomy   . Eardrum surgery   . Tympanostomy tube placement    History  Substance Use Topics  . Smoking status: Never Smoker   . Smokeless tobacco: Not on file  . Alcohol Use: No   No family history on file. No Known Allergies Current Outpatient Prescriptions on File Prior to Visit  Medication Sig Dispense Refill  . FLUoxetine (PROZAC) 40 MG capsule Take 1 capsule (40 mg total) by mouth daily.  30 capsule  2  . ibuprofen (ADVIL,MOTRIN) 800 MG tablet Take 1 tablet (800 mg total) by mouth as needed.  60 tablet  0  . PRENATAL VIT W/ FE BISG-FA PO Take by mouth daily.         The PMH, PSH, Social History, Family History, Medications, and allergies have been reviewed in [facility], and have been updated if relevant.  OBJECTIVE: She appears well, vital signs are as noted. Ears normal.  Throat and pharynx normal.  Neck supple. No adenopathy in the neck. Nose is congested. Sinuses non tender. The chest is clear, without wheezes or rales.  ASSESSMENT:  sinusitis  PLAN: Given duration and progression of symptoms, will treat for bacterial sinusitis- asks for Zpack since she typically gets bronchitis but lungs clear today. Symptomatic therapy suggested: push fluids, rest and return office visit prn if symptoms persist or worsen.  Call or return to clinic prn if these symptoms worsen or fail to improve as anticipated.

## 2011-07-23 ENCOUNTER — Other Ambulatory Visit: Payer: 59

## 2011-07-30 ENCOUNTER — Ambulatory Visit (INDEPENDENT_AMBULATORY_CARE_PROVIDER_SITE_OTHER): Payer: 59 | Admitting: Family Medicine

## 2011-07-30 ENCOUNTER — Encounter: Payer: Self-pay | Admitting: Family Medicine

## 2011-07-30 VITALS — BP 100/70 | HR 72 | Temp 98.1°F | Ht 65.0 in | Wt 165.0 lb

## 2011-07-30 DIAGNOSIS — Z0000 Encounter for general adult medical examination without abnormal findings: Secondary | ICD-10-CM

## 2011-07-30 DIAGNOSIS — Z136 Encounter for screening for cardiovascular disorders: Secondary | ICD-10-CM

## 2011-07-30 LAB — LIPID PANEL
HDL: 48 mg/dL (ref >39)
Total CHOL/HDL Ratio: 4.6 Ratio
VLDL: 26 mg/dL (ref 0–40)

## 2011-07-30 LAB — COMPREHENSIVE METABOLIC PANEL
ALT: 11 U/L (ref 0–35)
AST: 16 U/L (ref 0–37)
Alkaline Phosphatase: 53 U/L (ref 39–117)
BUN: 10 mg/dL (ref 6–23)
Creat: 0.85 mg/dL (ref 0.50–1.10)
Total Bilirubin: 0.9 mg/dL (ref 0.3–1.2)

## 2011-07-30 NOTE — Progress Notes (Signed)
30 yo G2P2 here for CPX.  Pap smear normal last year.  Pelvic pain- chronic issue. Was scheduled for ex lap in 01/2011 but cancelled due to issues with her benefits. Dr. Audie Box was supposed to perform it. Also has an umbilical hernia which was scheduled to be repaired at same time by another surgeon during her ex lap per pt.  No longer has IUD- had some bleeding several months after it was inserted and per pt, fell out at [employer] office (expelled).  Overall feels good.  Husband is considering a vasectomy.  Patient Active Problem List  Diagnoses  . POSTPARTUM DEPRESSION, MILD  . Routine general medical examination at a health care facility   No past medical history on file. Past Surgical History  Procedure Date  . Tonsillectomy   . Eardrum surgery   . Tympanostomy tube placement    History  Substance Use Topics  . Smoking status: Never Smoker   . Smokeless tobacco: Not on file  . Alcohol Use: No   No family history on file. No Known Allergies Current Outpatient Prescriptions on File Prior to Visit  Medication Sig Dispense Refill  . ibuprofen (ADVIL,MOTRIN) 800 MG tablet Take 1 tablet (800 mg total) by mouth as needed.  60 tablet  0   The PMH, PSH, Social History, Family History, Medications, and allergies have been reviewed in [facility], and have been updated if relevant.  ROS:  See HPI Patient reports no  vision/ hearing changes,anorexia, weight change, fever ,adenopathy, persistant / recurrent hoarseness, swallowing issues, chest pain, edema,persistant / recurrent cough, hemoptysis, dyspnea(rest, exertional, paroxysmal nocturnal), gastrointestinal  bleeding (melena, rectal bleeding), abdominal pain, excessive heart burn, GU symptoms(dysuria, hematuria, pyuria, voiding/incontinence  Issues) syncope, focal weakness, severe memory loss, concerning skin lesions, depression, anxiety, abnormal bruising/bleeding, major joint swelling, breast masses or abnormal vaginal bleeding.     Physical exam: BP 100/70  Pulse 72  Temp(Src) 98.1 F (36.7 C) (Oral)  Ht 5\' 5"  (1.651 m)  Wt 165 lb (74.844 kg)  BMI 27.46 kg/m2  LMP 07/17/2011  General:  Well-developed,well-nourished,in no acute distress; alert,appropriate and cooperative throughout examination Head:  normocephalic and atraumatic.   Eyes:  vision grossly intact, pupils equal, pupils round, and pupils reactive to light.   Ears:  R ear normal and L ear normal.   Nose:  no external deformity.   Mouth:  good dentition.   Neck:  No deformities, masses, or tenderness noted. Breasts:  No mass, nodules, thickening, tenderness, bulging, retraction, inflamation, nipple discharge or skin changes noted.   Lungs:  Normal respiratory effort, chest expands symmetrically. Lungs are clear to auscultation, no crackles or wheezes. Heart:  Normal rate and regular rhythm. S1 and S2 normal without gallop, murmur, click, rub or other extra sounds. Rectal:  no external abnormalities.   Msk:  No deformity or scoliosis noted of thoracic or lumbar spine.   Extremities:  No clubbing, cyanosis, edema, or deformity noted with normal full range of motion of all joints.   Neurologic:  alert & oriented X3 and gait normal.   Skin:  Intact without suspicious lesions or rashes Cervical Nodes:  No lymphadenopathy noted Axillary Nodes:  No palpable lymphadenopathy Psych:  Cognition and judgment appear intact. Alert and cooperative with normal attention span and concentration. No apparent delusions, illusions, hallucinations   Assessment and Plan: 1. Routine general medical examination at a health care facility  Reviewed preventive care protocols, scheduled due services, and updated immunizations Discussed nutrition, exercise, diet, and healthy lifestyle.  Comprehensive metabolic panel  2. Screening for ischemic heart disease  Lipid Panel

## 2011-07-30 NOTE — Patient Instructions (Signed)
Great to see you. Talk with Dr. Audie Box about an IUD or implanon. We will call you with your lab results.

## 2011-08-25 ENCOUNTER — Telehealth: Payer: Self-pay | Admitting: Family Medicine

## 2011-08-25 NOTE — Telephone Encounter (Signed)
Pt is calling concerning an IUD. She was wanting an IUD put in and she does remember you saying that Lowella Bandy was the only one trained to help put those in. She checked with her insurance to see if it was covered and it is covered 100 percent if it was to be done here in the office but it's not covered if she has it done at her OBGYN office. She was hoping that she would be able to have this done here.

## 2011-08-25 NOTE — Telephone Encounter (Signed)
Unfortunately, I am still not doing them here yet.

## 2011-12-03 ENCOUNTER — Other Ambulatory Visit: Payer: Self-pay

## 2011-12-03 MED ORDER — IBUPROFEN 800 MG PO TABS: 800.0000 mg | ORAL_TABLET | ORAL | Status: DC | PRN

## 2011-12-03 NOTE — Telephone Encounter (Signed)
Left v/m for pt to ck with pharmacy med sent in.

## 2011-12-03 NOTE — Telephone Encounter (Signed)
Pt had dental work this morning; pt thought she had Ibuprofen 800 mg at home. Pt is out of med and dentist office is closed.pt request refill Ibuprofen 800 mg to [facility].Please advise.

## 2012-02-23 ENCOUNTER — Other Ambulatory Visit: Payer: Self-pay

## 2012-02-23 ENCOUNTER — Encounter: Payer: Self-pay | Admitting: Family Medicine

## 2012-02-23 ENCOUNTER — Ambulatory Visit (INDEPENDENT_AMBULATORY_CARE_PROVIDER_SITE_OTHER): Payer: 59 | Admitting: Family Medicine

## 2012-02-23 VITALS — BP 100/60 | HR 88 | Temp 98.3°F | Wt 145.0 lb

## 2012-02-23 DIAGNOSIS — J329 Chronic sinusitis, unspecified: Secondary | ICD-10-CM

## 2012-02-23 MED ORDER — AMOXICILLIN 875 MG PO TABS: 875.0000 mg | ORAL_TABLET | Freq: Two times a day (BID) | ORAL | Status: DC

## 2012-02-23 MED ORDER — AMOXICILLIN-POT CLAVULANATE 875-125 MG PO TABS: 1.0000 | ORAL_TABLET | Freq: Two times a day (BID) | ORAL | Status: DC

## 2012-02-23 MED ORDER — IBUPROFEN 800 MG PO TABS: 800.0000 mg | ORAL_TABLET | ORAL | Status: DC | PRN

## 2012-02-23 NOTE — Patient Instructions (Addendum)
Take augmentin as directed- 1 tablet twice daily for 10 days.  Drink lots of fluids.  Treat sympotmatically with Mucinex, nasal saline irrigation, and Tylenol/Ibuprofen. Also try claritin D or zyrtec D over the counter- two times a day as needed ( have to sign for them at pharmacy). You can use warm compresses.  Cough suppressant at night. Call if not improving as expected in 5-7 days.

## 2012-02-23 NOTE — Telephone Encounter (Signed)
Amoxicillin and ibuprofen rx sent

## 2012-02-23 NOTE — Progress Notes (Signed)
SUBJECTIVE:  Evelyn Thomas is a 31 y.o. female who complains of coryza, congestion, sneezing, dry cough and bilateral sinus pain for 10 days. She denies a history of anorexia, chest pain, fatigue, fevers, myalgias, nausea, shortness of breath, sweats, vomiting, weakness and weight loss and denies a history of asthma. Patient denies smoke cigarettes.   Patient Active Problem List  Diagnosis  . POSTPARTUM DEPRESSION, MILD  . Routine general medical examination at a health care facility   No past medical history on file. Past Surgical History  Procedure Date  . Tonsillectomy   . Eardrum surgery   . Tympanostomy tube placement    History  Substance Use Topics  . Smoking status: Never Smoker   . Smokeless tobacco: Not on file  . Alcohol Use: No   No family history on file. No Known Allergies Current Outpatient Prescriptions on File Prior to Visit  Medication Sig Dispense Refill  . ibuprofen (ADVIL,MOTRIN) 800 MG tablet Take 1 tablet (800 mg total) by mouth as needed.  60 tablet  0   The PMH, PSH, Social History, Family History, Medications, and allergies have been reviewed in [facility], and have been updated if relevant.  OBJECTIVE: BP 100/60  Pulse 88  Temp 98.3 F (36.8 C)  Wt 145 lb (65.772 kg)  She appears well, vital signs are as noted. Ears normal.  Throat and pharynx normal.  Neck supple. No adenopathy in the neck. Nose is congested. Sinuses  Tender to palpation throughout. The chest is clear, without wheezes or rales.  ASSESSMENT:  sinusitis  PLAN: Given duration and progression of symptoms, will treat for bacterial sinusitis. Symptomatic therapy suggested: push fluids, rest and return office visit prn if symptoms persist or worsen.  Call or return to clinic prn if these symptoms worsen or fail to improve as anticipated.

## 2012-02-23 NOTE — Telephone Encounter (Signed)
Pt is at [employer] and augmentin co pay is $15.00; amoxicillin co pay is $4.00. Pt request antibiotic changed to Amoxicillin; pt did not pick up augmentin.Please advise.

## 2012-02-23 NOTE — Telephone Encounter (Signed)
Pt left note requesting refill on ibuprofen 800 mg to [facility].Please advise.

## 2012-02-28 NOTE — Telephone Encounter (Signed)
Pt called back to see why did not get a call back about med refills. I  Spoke with Darlena at [facility] and she said pt had already picked up Augmentin and got Ibuprofen as well. I spoke with pt and she did pick up Augmentin but has not gotten Ibuprofen yet. Pt said she thought all meds for antibiotics and things like Ibuprofen  at [facility] were $ 4.00. I advised thought there was a list of meds [facility] recognized at a price of $ 4.00. Pt will ck with [facility] about $4.00 charge and to pick up Ibuprofen.

## 2012-08-10 ENCOUNTER — Ambulatory Visit (INDEPENDENT_AMBULATORY_CARE_PROVIDER_SITE_OTHER): Payer: BC Managed Care – PPO | Admitting: Gynecology

## 2012-08-10 ENCOUNTER — Other Ambulatory Visit (HOSPITAL_COMMUNITY)
Admission: RE | Admit: 2012-08-10 | Discharge: 2012-08-10 | Disposition: A | Discharge status: Home / Self Care | Payer: Self-pay | Source: Ambulatory Visit | Attending: Gynecology | Admitting: Gynecology

## 2012-08-10 ENCOUNTER — Encounter: Payer: Self-pay | Admitting: Gynecology

## 2012-08-10 VITALS — BP 110/60 | Ht 65.0 in | Wt 144.0 lb

## 2012-08-10 DIAGNOSIS — Z01419 Encounter for gynecological examination (general) (routine) without abnormal findings: Secondary | ICD-10-CM

## 2012-08-10 DIAGNOSIS — Z309 Encounter for contraceptive management, unspecified: Secondary | ICD-10-CM

## 2012-08-10 DIAGNOSIS — Z13220 Encounter for screening for lipoid disorders: Secondary | ICD-10-CM

## 2012-08-10 DIAGNOSIS — Z1151 Encounter for screening for human papillomavirus (HPV): Secondary | ICD-10-CM | POA: Insufficient documentation

## 2012-08-10 DIAGNOSIS — Z131 Encounter for screening for diabetes mellitus: Secondary | ICD-10-CM

## 2012-08-10 LAB — CBC WITH DIFFERENTIAL/PLATELET
Basophils Absolute: 0 10*3/uL (ref 0.0–0.1)
Eosinophils Absolute: 0.3 10*3/uL (ref 0.0–0.7)
Eosinophils Relative: 5 % (ref 0–5)
Lymphocytes Relative: 37 % (ref 12–46)
Lymphs Abs: 2 10*3/uL (ref 0.7–4.0)
Neutrophils Relative %: 51 % (ref 43–77)
Platelets: 223 10*3/uL (ref 150–400)
RBC: 4.63 MIL/uL (ref 3.87–5.11)
RDW: 12.6 % (ref 11.5–15.5)
WBC: 5.4 10*3/uL (ref 4.0–10.5)

## 2012-08-10 LAB — LIPID PANEL
HDL: 49 mg/dL (ref >39)
LDL Cholesterol: 110 mg/dL — ABNORMAL HIGH (ref 0–99)
Triglycerides: 109 mg/dL (ref <150)

## 2012-08-10 LAB — GLUCOSE, RANDOM: Glucose, Bld: 80 mg/dL (ref 70–99)

## 2012-08-10 NOTE — Addendum Note (Signed)
Addended by: Dayna Barker on: 08/10/2012 04:23 PM   Modules accepted: Orders

## 2012-08-10 NOTE — Progress Notes (Signed)
JULYANA WOOLVERTON 1980/05/23 [id number]        32 y.o.  [id number] for annual exam.  Several issues noted below.  Past medical history,surgical history, medications, allergies, family history and social history were all reviewed and documented in the EPIC chart. ROS:  Was performed and pertinent positives and negatives are included in the history.  Exam: Kim assistant Filed Vitals:   08/10/12 1535  BP: 110/60  Height: 5\' 5"  (1.651 m)  Weight: 144 lb (65.318 kg)   General appearance  Normal Skin grossly normal Head/Neck normal with no cervical or supraclavicular adenopathy thyroid normal Lungs  clear Cardiac RR, without RMG Abdominal  soft, nontender, without masses, organomegaly or hernia Breasts  examined lying and sitting without masses, retractions, discharge or axillary adenopathy. Pelvic  Ext/BUS/vagina  normal   Cervix  normal Pap/HPV  Uterus  anteverted, normal size, shape and contour, midline and mobile nontender   Adnexa  Without masses or tenderness    Anus and perineum  normal   Rectovaginal  normal sphincter tone without palpated masses or tenderness.    Assessment/Plan:  32 y.o. [id number] female for annual exam, regular menses, condom birth control.   1. Contraceptive management. Patient using condoms and I reviewed the failure risk with these. Had IUD but expelled it. Had been planning vasectomy but ultimately decided against this. We discussed contraceptive options and patient's wanting to try the Mirena IUD again. She had one for 5 years and did well up until the very end. Possible slight increased risk for expulsion discussed. I gave her literature on this and she is going to followup with her next menses for IUD placement. 2. Pap smear 2012. Pap/HPV today. No history of abnormal Pap smears previously. Assuming negative and plan less frequent screening interval. 3. Breast health. SBE monthly reviewed. Plan mammogram closer to 39. 4. Health maintenance. Baseline CBC glucose  lipid profile urinalysis ordered. Followup for IUD placement otherwise annually.    Dara Lords MD, 4:18 PM 08/10/2012

## 2012-08-10 NOTE — Patient Instructions (Signed)
Follow up in one year for annual exam. Follow up for IUD if you choose.

## 2012-08-11 ENCOUNTER — Other Ambulatory Visit: Payer: Self-pay | Admitting: Gynecology

## 2012-08-11 DIAGNOSIS — Z3049 Encounter for surveillance of other contraceptives: Secondary | ICD-10-CM

## 2012-08-11 LAB — URINALYSIS W MICROSCOPIC + REFLEX CULTURE
Casts: NONE SEEN
Crystals: NONE SEEN
Glucose, UA: NEGATIVE mg/dL
Nitrite: NEGATIVE
Specific Gravity, Urine: 1.025 (ref 1.005–1.030)
pH: 6 (ref 5.0–8.0)

## 2012-08-11 MED ORDER — LEVONORGESTREL 20 MCG/24HR IU IUD: INTRAUTERINE_SYSTEM | Freq: Once | INTRAUTERINE | Status: AC

## 2012-08-12 LAB — URINE CULTURE
Colony Count: NO GROWTH
Organism ID, Bacteria: NO GROWTH

## 2012-08-31 ENCOUNTER — Ambulatory Visit (INDEPENDENT_AMBULATORY_CARE_PROVIDER_SITE_OTHER): Payer: BC Managed Care – PPO | Admitting: Gynecology

## 2012-08-31 ENCOUNTER — Encounter: Payer: Self-pay | Admitting: Gynecology

## 2012-08-31 DIAGNOSIS — Z30431 Encounter for routine checking of intrauterine contraceptive device: Secondary | ICD-10-CM

## 2012-08-31 NOTE — Patient Instructions (Signed)
Intrauterine Device Insertion Most often, an intrauterine device (IUD) is inserted into the uterus to prevent pregnancy. There are 2 types of IUDs available:  Copper IUD. This type of IUD creates an environment that is not favorable to sperm survival. The mechanism of action of the copper IUD is not known for certain. It can stay in place for 10 years.  Hormone IUD. This type of IUD contains the hormone progestin (synthetic progesterone). The progestin thickens the cervical mucus and prevents sperm from entering the uterus, and it also thins the uterine lining. There is no evidence that the hormone IUD prevents implantation. The hormone IUD can stay in place for up to 5 years. An IUD is the most cost-effective birth control if left in place for the full duration. It may be removed at any time. LET YOUR CAREGIVER KNOW ABOUT:  Sensitivity to metals.  Medicines taken including herbs, eyedrops, over-the-counter medicines, and creams.  Use of steroids (by mouth or creams).  Previous problems with anesthetics or numbing medicine.  Previous gynecological surgery.  History of blood clots or clotting disorders.  Possibility of pregnancy.  Menstrual irregularities.  Concerns regarding unusual vaginal discharge or odors.  Previous experience with an IUD.  Other health problems. RISKS AND COMPLICATIONS  Accidental puncture (perforation) of the uterus.  Accidental placement of the IUD either in the muscle layer of the uterus (myometrium) or outside the uterus. If this happen, the IUD can be found essentially floating around the bowels. When this happens, the IUD must be taken out surgically.  The IUD may fall out of the uterus (expulsion). This is more common in women who have recently had a child.   Pregnancy in the fallopian tube (ectopic). BEFORE THE PROCEDURE  Schedule the IUD insertion for when you will have your menstrual period or right after, to make sure you are not pregnant.  Placement of the IUD is better tolerated shortly after a menstrual cycle.  You may need to take tests or be examined to make sure you are not pregnant.  You may be required to take a pregnancy test.  You may be required to get checked for sexually transmitted infections (STIs) prior to placement. Placing an IUD in someone who has an infection can make an infection worse.  You may be given a pain reliever to take 1 or 2 hours before the procedure.  An exam will be performed to determine the size and position of your uterus.  Ask your caregiver about changing or stopping your regular medicines. PROCEDURE   A tool (speculum) is placed in the vagina. This allows your caregiver to see the lower part of the uterus (cervix).  The cervix is prepped with a medicine that lowers the risk of infection.  You may be given a medicine to numb each side of the cervix (intracervical or paracervical block). This is used to block and control any discomfort with insertion.  A tool (uterine sound) is inserted into the uterus to determine the length of the uterine cavity and the direction the uterus may be tilted.  A slim instrument (IUD inserter) is inserted through the cervical canal and into your uterus.  The IUD is placed in the uterine cavity and the insertion device is removed.  The nylon string that is attached to the IUD, and used for eventual IUD removal, is trimmed. It is trimmed so that it lays high in the vagina, just outside the cervix. AFTER THE PROCEDURE  You may have bleeding after the   procedure. This is normal. It varies from light spotting for a few days to menstrual-like bleeding.  You may have mild cramping.  Practice checking the string coming out of the cervix to make sure the IUD remains in the uterus. If you cannot feel the string, you should schedule a "string check" with your caregiver.  If you had a hormone IUD inserted, expect that your period may be lighter or nonexistent  within a year's time (though this is not always the case). There may be delayed fertility with the hormone IUD as a result of its progesterone effect. When you are ready to become pregnant, it is suggested to have the IUD removed up to 1 year in advance.  Yearly exams are advised. Document Released: 10/28/2010 Document Revised: 05/24/2011 Document Reviewed: 10/28/2010 Exit[employer] Patient Information 2014 [employer].  

## 2012-08-31 NOTE — Progress Notes (Signed)
Patient presents for Mirena IUD placement. She has read through the booklet, has no contraindications and signed the consent form. She currently is on a normal menses.  I reviewed the insertional process with her as well as the risks to include infection either immediate or long-term, uterine perforation or migration requiring surgery to remove, other complications such as pain, hormonal side effects and possibilities of failure with subsequent pregnancy.    Exam with Kim assistant Pelvic: External BUS vagina normal. Cervix normal with menses flow. Uterus anteverted normal size shape contour midline mobile nontender. Adnexa without masses or tenderness.  Procedure: The cervix was cleansed with Betadine, anterior lip grasped with a single-tooth tenaculum, the uterus was sounded and a Mirena IUD was placed according to manufacturer's recommendations without difficulty. The strings were trimmed. The patient tolerated well and will follow up in one month for a postinsertional check.  Lot number:  [id number]

## 2012-09-04 ENCOUNTER — Telehealth: Payer: Self-pay | Admitting: *Deleted

## 2012-09-04 NOTE — Telephone Encounter (Signed)
Not unusual at all. Sometimes patients will cramp on and off for the first month but he should be getting better slowly. As long as everything else is okay i.e. no fevers then I would watch for now.

## 2012-09-04 NOTE — Telephone Encounter (Signed)
Left the below note on pt voicemail. 

## 2012-09-04 NOTE — Telephone Encounter (Signed)
Pt had IUD placed on 08/31/12 is doing well with except for the cramping. Pt does have motrin 800 mg tablets that she is taking for the cramping, she asked if this is normal to be 5 days out from placement and still have cramping? Other wise pt doing well. Please advise

## 2012-09-07 ENCOUNTER — Ambulatory Visit: Payer: BC Managed Care – PPO | Admitting: Gynecology

## 2012-09-12 ENCOUNTER — Ambulatory Visit: Payer: BC Managed Care – PPO | Admitting: Gynecology

## 2012-10-02 ENCOUNTER — Ambulatory Visit (INDEPENDENT_AMBULATORY_CARE_PROVIDER_SITE_OTHER): Payer: BC Managed Care – PPO | Admitting: Gynecology

## 2012-10-02 ENCOUNTER — Encounter: Payer: Self-pay | Admitting: Gynecology

## 2012-10-02 DIAGNOSIS — J322 Chronic ethmoidal sinusitis: Secondary | ICD-10-CM

## 2012-10-02 DIAGNOSIS — Z30431 Encounter for routine checking of intrauterine contraceptive device: Secondary | ICD-10-CM

## 2012-10-02 MED ORDER — AZITHROMYCIN 250 MG PO TABS: ORAL_TABLET | ORAL | Status: DC

## 2012-10-02 NOTE — Patient Instructions (Addendum)
Follow up for ultrasound as scheduled 

## 2012-10-02 NOTE — Progress Notes (Signed)
Patient presents for IUD check.  IUD was placed 19 June and since then has had some spotting on and off otherwise doing well. Does note of the past week URI symptoms for which she has been taking OTC medications. She's had cough, nonproductive with head congestion. Awoke this morning with periorbital discomfort and greenish mucus on blowing her nose. Does have a history of sinusitis in the past.  Exam was [employer] normal with the exception of periorbital tenderness to palpation. No overlying skin erythema, pharyngitis or cervical lymphadenopathy. Lungs clear without wheezing or rhonchi. Cardiac regular rate no rubs murmurs or gallops. Abdomen soft nontender without masses guarding rebound organomegaly. Pelvic external BUS vagina normal. Cervix normal with IUD string not visualized. Uterus normal size, mobile nontender. Adnexa without masses or tenderness.  Colposcopic evaluation with endocervical speculum unable to identify the IUD string noting very redundant endocervical mucosa.  Assessment and plan: 1. IUD. Strength not visualized. Schedule ultrasound for location. 2. Sinusitis. Classic presentation. Will cover with azithromycin 500 mg one day, 250 mg x4 days. Followup if symptoms persist, worsen or recur.

## 2012-10-11 ENCOUNTER — Encounter: Payer: Self-pay | Admitting: Gynecology

## 2012-10-11 ENCOUNTER — Ambulatory Visit (INDEPENDENT_AMBULATORY_CARE_PROVIDER_SITE_OTHER): Payer: BC Managed Care – PPO | Admitting: Gynecology

## 2012-10-11 ENCOUNTER — Ambulatory Visit (INDEPENDENT_AMBULATORY_CARE_PROVIDER_SITE_OTHER): Payer: BC Managed Care – PPO

## 2012-10-11 DIAGNOSIS — N839 Noninflammatory disorder of ovary, fallopian tube and broad ligament, unspecified: Secondary | ICD-10-CM

## 2012-10-11 DIAGNOSIS — Z30431 Encounter for routine checking of intrauterine contraceptive device: Secondary | ICD-10-CM

## 2012-10-11 DIAGNOSIS — N83209 Unspecified ovarian cyst, unspecified side: Secondary | ICD-10-CM

## 2012-10-11 NOTE — Patient Instructions (Signed)
Follow up for ultrasound in 3 months 

## 2012-10-11 NOTE — Progress Notes (Signed)
Patient presents for ultrasound for IUD location.  Ultrasound shows uterus generous in size without evidence of pathology. Endometrial echo 5.9 mm. IUD located within the endometrial canal. Right ovary normal. Left ovary with 2 apparent cysts 31 mm and 33 mm mean. Both echo-free and avascular. Cul-de-sac negative.  Assessment and plan: 1. IUD management. IUD within proper location. Will continue to monitor. 2. Simple avascular left ovarian cysts. 31 and 33 mm mean. Patient asymptomatic. Recommend observation with re\re ultrasound in 3 months. Various scenarios to include resolution persistence or enlargement discussed. Possible surgery if enlarging. Importance of followup stressed.

## 2012-10-25 ENCOUNTER — Telehealth: Payer: Self-pay | Admitting: *Deleted

## 2012-10-25 MED ORDER — MEGESTROL ACETATE 20 MG PO TABS: 20.0000 mg | ORAL_TABLET | Freq: Every day | ORAL | Status: DC

## 2012-10-25 NOTE — Telephone Encounter (Signed)
Pt has IUD placed June 19 pt said for the last 2 months she has been having spotting everyday now. Pt was told to follow up if this was to continue. Please advise

## 2012-10-25 NOTE — Telephone Encounter (Signed)
Pt informed with the below note, rx sent. 

## 2012-10-25 NOTE — Telephone Encounter (Signed)
Recommend trial of Megace 20 mg daily for 10 days. If spotting continues after that let me know and we can try something different.

## 2012-11-03 ENCOUNTER — Telehealth: Payer: Self-pay | Admitting: *Deleted

## 2012-11-03 MED ORDER — IBUPROFEN 800 MG PO TABS: 800.0000 mg | ORAL_TABLET | ORAL | Status: DC | PRN

## 2012-11-03 NOTE — Telephone Encounter (Signed)
rx sent, pt informed.  

## 2012-11-03 NOTE — Telephone Encounter (Signed)
Pt called to see if you would be willing to fill her motrin 800 mg as needed. Pt normally has her PCP fill this but she hasn't see him in over a year. She would only go for annual, but saw you for annual this year. Okay to fill?

## 2012-11-03 NOTE — Telephone Encounter (Signed)
I am okay with that. Motrin 800 mg #60 one by mouth Q8 hours when necessary pain with 3 refills

## 2013-01-10 ENCOUNTER — Ambulatory Visit (INDEPENDENT_AMBULATORY_CARE_PROVIDER_SITE_OTHER): Payer: BC Managed Care – PPO

## 2013-01-10 ENCOUNTER — Ambulatory Visit (INDEPENDENT_AMBULATORY_CARE_PROVIDER_SITE_OTHER): Payer: BC Managed Care – PPO | Admitting: Gynecology

## 2013-01-10 ENCOUNTER — Encounter: Payer: Self-pay | Admitting: Gynecology

## 2013-01-10 DIAGNOSIS — R51 Headache: Secondary | ICD-10-CM

## 2013-01-10 DIAGNOSIS — N949 Unspecified condition associated with female genital organs and menstrual cycle: Secondary | ICD-10-CM

## 2013-01-10 DIAGNOSIS — N83209 Unspecified ovarian cyst, unspecified side: Secondary | ICD-10-CM

## 2013-01-10 DIAGNOSIS — N923 Ovulation bleeding: Secondary | ICD-10-CM

## 2013-01-10 DIAGNOSIS — N921 Excessive and frequent menstruation with irregular cycle: Secondary | ICD-10-CM

## 2013-01-10 DIAGNOSIS — N8300 Follicular cyst of ovary, unspecified side: Secondary | ICD-10-CM

## 2013-01-10 DIAGNOSIS — N938 Other specified abnormal uterine and vaginal bleeding: Secondary | ICD-10-CM

## 2013-01-10 MED ORDER — BUTALBITAL-APAP-CAFFEINE 50-325-40 MG PO TABS: 1.0000 | ORAL_TABLET | Freq: Two times a day (BID) | ORAL | Status: DC | PRN

## 2013-01-10 MED ORDER — MEGESTROL ACETATE 20 MG PO TABS: 20.0000 mg | ORAL_TABLET | Freq: Every day | ORAL | Status: DC

## 2013-01-10 NOTE — Patient Instructions (Signed)
Take Megace medication daily for 2 weeks. Call me if irregular bleeding continues. Tried the fioricet for the headaches. If they continue or worsen then call me and I will refer you to see a neurologist.

## 2013-01-10 NOTE — Progress Notes (Signed)
Patient presents for followup ultrasound. Head ultrasound in July which showed a generous cyst on the left ovary. She was asked to return in followup. Also notes over the last 2 weeks she has bled on and off steadily with light staining. She has had episodes of recurrent bleeding since the placement of her IUD.  Ultrasound shows uterus grossly normal in size and echotexture. Endometrial echo 5.2 mm. IUD within normal position. Left ovary with physiologic changes but prior larger cysts resolved. Has a 24 mm and a 20 mm simple follicles today. Right ovary normal. Cul-de-sac negative.  Assessment and plan: 1. Ovarian cysts. Have resolved. We'll plan expectant management. 2. DUB with IUD. Trial of Megace 20 mg daily x2 weeks. Discussed that if her bleeding continues then we may need to remove her IUD. Patient will call and followup if irregular bleeding continues. Has a history of prior Mirena IUD for 5 years did well. Had pregnancy and subsequent IUD placed afterwards which she spontaneously expelled. This IUD is her third replacement 3. Headaches. Patient noted several times each month she gets headaches described as persistent pounding. Feels to be tension in nature. No throbbing or aura. No neurologic associated symptoms. Takes ibuprofen with some relief. Fioricet #20 no refill provided as a trial. If her headaches would worsen discussed referral to a neurologist. 4. Insomnia. Patient having difficulty falling asleep. Notes that her mind seems to race thinking of things. Has tried Tylenol PM and did well with this. Suggested a trial of melatonin to see if this doesn't help. Possible sleep aid such as Ambien reviewed although discussed with her that I would not want to see her take this routinely. 5. Questions about weight loss. I reviewed the need for regular exercise and dietary discretion. Do not feel supplements her medication warranted.

## 2013-01-19 ENCOUNTER — Telehealth: Payer: Self-pay | Admitting: *Deleted

## 2013-01-19 ENCOUNTER — Other Ambulatory Visit: Payer: Self-pay | Admitting: Family Medicine

## 2013-01-19 MED ORDER — MEGESTROL ACETATE 20 MG PO TABS: 20.0000 mg | ORAL_TABLET | Freq: Two times a day (BID) | ORAL | Status: DC

## 2013-01-19 NOTE — Telephone Encounter (Signed)
erorr

## 2013-01-19 NOTE — Telephone Encounter (Signed)
Pt called c/o vaginal bleeding taking megace given on OV 01/10/13 has about 5 pills left. Pt said bleeding did slow down, but never completely stopped, started heavy this am with cramping. Pt asked me to relay information to you. Please advise

## 2013-01-19 NOTE — Telephone Encounter (Signed)
Last office visit 02/23/2012.  Ok to refill?

## 2013-01-19 NOTE — Telephone Encounter (Signed)
Pt informed, rx sent 

## 2013-01-19 NOTE — Telephone Encounter (Signed)
Megace 20 mg #20. Tell patient to increase to twice daily for several days and then down to once daily until gone.

## 2013-01-19 NOTE — Telephone Encounter (Signed)
Last office visit?

## 2013-01-23 ENCOUNTER — Telehealth: Payer: Self-pay | Admitting: *Deleted

## 2013-01-23 MED ORDER — AZITHROMYCIN 250 MG PO TABS: ORAL_TABLET | ORAL | Status: DC

## 2013-01-23 NOTE — Telephone Encounter (Signed)
Pt called requesting if you would refill azithromycin 500 mg one day, 250 mg x4 days given on 10/02/12. Pt has another infection headache, greenish drainage, pressure. Please advise

## 2013-01-23 NOTE — Telephone Encounter (Signed)
Pt informed, rx sent 

## 2013-01-23 NOTE — Telephone Encounter (Signed)
Okay for Z-Pak.

## 2013-02-12 DIAGNOSIS — T839XXA Unspecified complication of genitourinary prosthetic device, implant and graft, initial encounter: Secondary | ICD-10-CM

## 2013-02-12 HISTORY — DX: Unspecified complication of genitourinary prosthetic device, implant and graft, initial encounter: T83.9XXA

## 2013-03-13 ENCOUNTER — Encounter: Payer: Self-pay | Admitting: Gynecology

## 2013-03-13 ENCOUNTER — Ambulatory Visit (INDEPENDENT_AMBULATORY_CARE_PROVIDER_SITE_OTHER): Payer: BC Managed Care – PPO | Admitting: Gynecology

## 2013-03-13 DIAGNOSIS — Z30432 Encounter for removal of intrauterine contraceptive device: Secondary | ICD-10-CM

## 2013-03-13 DIAGNOSIS — T8389XS Other specified complication of genitourinary prosthetic devices, implants and grafts, sequela: Secondary | ICD-10-CM

## 2013-03-13 NOTE — Patient Instructions (Signed)
Followup if irregular bleeding continues. Followup if you want to discuss alternative contraception. Use Plan B available in the pharmacy without a prescription if there is a break or slip and the condom. Followup May 2015 for annual exam when you are due.

## 2013-03-13 NOTE — Progress Notes (Signed)
Patient presents to have her Mirena IUD removed. It was placed in July 2014. Since then she has been having bleeding on and off treated with repeated episodes of Megace. The bleeding will stop with the Megace treatment but then returns. She also notes that she just does not feel good. She feels bloated with cramping that comes and goes. We have discussed contraceptive alternatives to include pill, patch, ring, nexplanon, Depo-Provera, sterilization. Patient has used condoms for several years without difficulty and she desires to continue with condoms at this time. She apparently had tried the ring previously and did not like it. Has difficulty remembering pills. Does not want to risk the issues of irregular bleeding with Depo-Provera or nexplanon. Increased failure risk with condoms discussed. Plan B availability.  Exam with Selena Batten assistant External BUS vagina normal. Cervix normal. IUD string visualized. Uterus normal size midline mobile nontender. Adnexa without masses or tenderness.  Assessment and plan: IUD complication with continued bleeding on and off with cramping and bloating. The Mirena IUD string was visualized, grasped with [facility] forcep and the Mirena IUD was removed, shown to the patient and discarded. Patient will use condoms at her choice after discussion for alternatives as above.

## 2013-03-26 ENCOUNTER — Telehealth: Payer: Self-pay | Admitting: Family Medicine

## 2013-03-26 ENCOUNTER — Telehealth: Payer: Self-pay | Admitting: *Deleted

## 2013-03-26 NOTE — Telephone Encounter (Signed)
RN attempted a call back to patient, left vm.

## 2013-03-26 NOTE — Telephone Encounter (Signed)
Pt called c/o chest pain x 1 week off and on, has PCP but no seen in a couple of years. I advised patient that PCP or urgent care or ER would be best. Pt was asking about EKG I explained to patient we do not have a machine here to do this. Pt is going to call PCP to be seen, will go to urgent care/ER if chest pain should become more frequently.

## 2013-03-26 NOTE — Telephone Encounter (Signed)
Patient Information:  Caller Name: Archie Pattenonya  Phone: [phone]  Patient: Evelyn Thomas, Evelyn Thomas  Gender: Female  DOB: 05-29-1980  Age: 33 Years  PCP: Ruthe MannanAron, [facility](Family Practice)  Pregnant: No  Office Follow Up:  Does the office need to follow up with this patient?: No  Instructions For The Office: N/A  RN Note:  Spoke with office staff (per office policy) who gave ok to send to ED. Advised to go to ED now with driver. States she will go to [facility] b/c it is much [facility].  Symptoms  Reason For Call & Symptoms: Chest pain she describes as sharp shooting or stabbing pain for few seconds each time occuring intermittently since 12/31 but increasing in frequency. Also reports a pain in back, between shoulder blades as well. No SOB, or any other sxs and no cardiac hx.  Reviewed Health History In EMR: Yes  Reviewed Medications In EMR: Yes  Reviewed Allergies In EMR: Yes  Reviewed Surgeries / Procedures: Yes  Date of Onset of Symptoms: 03/14/2013 OB / GYN:  LMP: 03/15/2013  Guideline(s) Used:  Chest Pain  Disposition Per Guideline:   Go to ED Now (or to Office with PCP Approval)  Reason For Disposition Reached:   Intermittent chest pain and pain has been increasing in severity or frequency  Advice Given:  N/A  Patient Will Follow Care Advice:  YES

## 2013-03-27 ENCOUNTER — Emergency Department: Payer: Self-pay | Admitting: Emergency Medicine

## 2013-03-27 LAB — COMPREHENSIVE METABOLIC PANEL
ALK PHOS: 55 U/L
Albumin: 3.5 g/dL (ref 3.4–5.0)
Anion Gap: 4 — ABNORMAL LOW (ref 7–16)
BUN: 11 mg/dL (ref 7–18)
Bilirubin,Total: 0.3 mg/dL (ref 0.2–1.0)
CHLORIDE: 103 mmol/L (ref 98–107)
Calcium, Total: 8.5 mg/dL (ref 8.5–10.1)
Co2: 28 mmol/L (ref 21–32)
Creatinine: 0.82 mg/dL (ref 0.60–1.30)
GLUCOSE: 109 mg/dL — AB (ref 65–99)
Osmolality: 270 (ref 275–301)
Potassium: 3.3 mmol/L — ABNORMAL LOW (ref 3.5–5.1)
SGOT(AST): 25 U/L (ref 15–37)
SGPT (ALT): 23 U/L (ref 12–78)
Sodium: 135 mmol/L — ABNORMAL LOW (ref 136–145)
Total Protein: 6.6 g/dL (ref 6.4–8.2)

## 2013-03-27 LAB — CBC
HCT: 39.1 % (ref 35.0–47.0)
HGB: 13.6 g/dL (ref 12.0–16.0)
MCH: 31.5 pg (ref 26.0–34.0)
MCHC: 34.9 g/dL (ref 32.0–36.0)
MCV: 90 fL (ref 80–100)
PLATELETS: 247 10*3/uL (ref 150–440)
RBC: 4.33 10*6/uL (ref 3.80–5.20)
RDW: 12.8 % (ref 11.5–14.5)
WBC: 9.2 10*3/uL (ref 3.6–11.0)

## 2013-03-27 LAB — URINALYSIS, COMPLETE
Bacteria: NONE SEEN
Bilirubin,UR: NEGATIVE
Glucose,UR: NEGATIVE mg/dL (ref 0–75)
Ketone: NEGATIVE
Leukocyte Esterase: NEGATIVE
Nitrite: NEGATIVE
Ph: 6 (ref 4.5–8.0)
Protein: NEGATIVE
RBC,UR: 1 /HPF (ref 0–5)
SPECIFIC GRAVITY: 1.019 (ref 1.003–1.030)
WBC UR: 1 /HPF (ref 0–5)

## 2013-03-27 LAB — CK TOTAL AND CKMB (NOT AT ARMC)
CK, Total: 148 U/L (ref 21–215)
CK-MB: 0.7 ng/mL (ref 0.5–3.6)

## 2013-03-27 LAB — LIPASE, BLOOD: Lipase: 163 U/L (ref 73–393)

## 2013-03-27 LAB — TROPONIN I

## 2013-03-29 ENCOUNTER — Ambulatory Visit: Payer: BC Managed Care – PPO | Admitting: Gynecology

## 2014-01-14 ENCOUNTER — Encounter: Payer: Self-pay | Admitting: Gynecology

## 2014-03-13 ENCOUNTER — Ambulatory Visit (INDEPENDENT_AMBULATORY_CARE_PROVIDER_SITE_OTHER): Payer: BC Managed Care – PPO | Admitting: Gynecology

## 2014-03-13 ENCOUNTER — Encounter: Payer: Self-pay | Admitting: Gynecology

## 2014-03-13 VITALS — BP 114/70 | Ht 64.0 in | Wt 175.0 lb

## 2014-03-13 DIAGNOSIS — Z01419 Encounter for gynecological examination (general) (routine) without abnormal findings: Secondary | ICD-10-CM

## 2014-03-13 DIAGNOSIS — N926 Irregular menstruation, unspecified: Secondary | ICD-10-CM

## 2014-03-13 LAB — COMPREHENSIVE METABOLIC PANEL
ALT: 12 U/L (ref 0–35)
AST: 17 U/L (ref 0–37)
Albumin: 4.1 g/dL (ref 3.5–5.2)
Alkaline Phosphatase: 48 U/L (ref 39–117)
BUN: 10 mg/dL (ref 6–23)
CALCIUM: 8.6 mg/dL (ref 8.4–10.5)
CO2: 27 mEq/L (ref 19–32)
Chloride: 104 mEq/L (ref 96–112)
Creat: 0.87 mg/dL (ref 0.50–1.10)
Glucose, Bld: 85 mg/dL (ref 70–99)
Potassium: 3.5 mEq/L (ref 3.5–5.3)
SODIUM: 140 meq/L (ref 135–145)
Total Bilirubin: 0.6 mg/dL (ref 0.2–1.2)
Total Protein: 6.1 g/dL (ref 6.0–8.3)

## 2014-03-13 LAB — CBC WITH DIFFERENTIAL/PLATELET
BASOS ABS: 0 10*3/uL (ref 0.0–0.1)
Basophils Relative: 0 % (ref 0–1)
EOS ABS: 0.3 10*3/uL (ref 0.0–0.7)
EOS PCT: 5 % (ref 0–5)
HEMATOCRIT: 40.4 % (ref 36.0–46.0)
HEMOGLOBIN: 14.1 g/dL (ref 12.0–15.0)
Lymphocytes Relative: 31 % (ref 12–46)
Lymphs Abs: 1.9 10*3/uL (ref 0.7–4.0)
MCH: 31.5 pg (ref 26.0–34.0)
MCHC: 34.9 g/dL (ref 30.0–36.0)
MCV: 90.4 fL (ref 78.0–100.0)
MONOS PCT: 6 % (ref 3–12)
MPV: 12.1 fL (ref 8.6–12.4)
Monocytes Absolute: 0.4 10*3/uL (ref 0.1–1.0)
NEUTROS ABS: 3.5 10*3/uL (ref 1.7–7.7)
Neutrophils Relative %: 58 % (ref 43–77)
PLATELETS: 237 10*3/uL (ref 150–400)
RBC: 4.47 MIL/uL (ref 3.87–5.11)
RDW: 13.3 % (ref 11.5–15.5)
WBC: 6 10*3/uL (ref 4.0–10.5)

## 2014-03-13 LAB — LIPID PANEL
Cholesterol: 196 mg/dL (ref 0–200)
HDL: 55 mg/dL (ref >39)
LDL Cholesterol: 124 mg/dL — ABNORMAL HIGH (ref 0–99)
TRIGLYCERIDES: 87 mg/dL (ref <150)
Total CHOL/HDL Ratio: 3.6 Ratio
VLDL: 17 mg/dL (ref 0–40)

## 2014-03-13 NOTE — Patient Instructions (Signed)
You may obtain a copy of any labs that were done today by logging onto MyChart as outlined in the instructions provided with your AVS (after visit summary). The office will not call with normal lab results but certainly if there are any significant abnormalities then we will contact you.   Health Maintenance, Female A healthy lifestyle and preventative care can promote health and wellness.  Maintain regular health, dental, and eye exams.  Eat a healthy diet. Foods like vegetables, fruits, whole grains, low-fat dairy products, and lean protein foods contain the nutrients you need without too many calories. Decrease your intake of foods high in solid fats, added sugars, and salt. Get information about a proper diet from your caregiver, if necessary.  Regular physical exercise is one of the most important things you can do for your health. Most adults should get at least 150 minutes of moderate-intensity exercise (any activity that increases your heart rate and causes you to sweat) each week. In addition, most adults need muscle-strengthening exercises on 2 or more days a week.   Maintain a healthy weight. The body mass index (BMI) is a screening tool to identify possible weight problems. It provides an estimate of body fat based on height and weight. Your caregiver can help determine your BMI, and can help you achieve or maintain a healthy weight. For adults 20 years and older:  A BMI below 18.5 is considered underweight.  A BMI of 18.5 to 24.9 is normal.  A BMI of 25 to 29.9 is considered overweight.  A BMI of 30 and above is considered obese.  Maintain normal blood lipids and cholesterol by exercising and minimizing your intake of saturated fat. Eat a balanced diet with plenty of fruits and vegetables. Blood tests for lipids and cholesterol should begin at age 61 and be repeated every 5 years. If your lipid or cholesterol levels are high, you are over 50, or you are a high risk for heart  disease, you may need your cholesterol levels checked more frequently.Ongoing high lipid and cholesterol levels should be treated with medicines if diet and exercise are not effective.  If you smoke, find out from your caregiver how to quit. If you do not use tobacco, do not start.  Lung cancer screening is recommended for adults aged 33 80 years who are at high risk for developing lung cancer because of a history of smoking. Yearly low-dose computed tomography (CT) is recommended for people who have at least a 30-pack-year history of smoking and are a current smoker or have quit within the past 15 years. A pack year of smoking is smoking an average of 1 pack of cigarettes a day for 1 year (for example: 1 pack a day for 30 years or 2 packs a day for 15 years). Yearly screening should continue until the smoker has stopped smoking for at least 15 years. Yearly screening should also be stopped for people who develop a health problem that would prevent them from having lung cancer treatment.  If you are pregnant, do not drink alcohol. If you are breastfeeding, be very cautious about drinking alcohol. If you are not pregnant and choose to drink alcohol, do not exceed 1 drink per day. One drink is considered to be 12 ounces (355 mL) of beer, 5 ounces (148 mL) of wine, or 1.5 ounces (44 mL) of liquor.  Avoid use of street drugs. Do not share needles with anyone. Ask for help if you need support or instructions about stopping  the use of drugs.  High blood pressure causes heart disease and increases the risk of stroke. Blood pressure should be checked at least every 1 to 2 years. Ongoing high blood pressure should be treated with medicines, if weight loss and exercise are not effective.  If you are 59 to 34 years old, ask your caregiver if you should take aspirin to prevent strokes.  Diabetes screening involves taking a blood sample to check your fasting blood sugar level. This should be done once every 3  years, after age 91, if you are within normal weight and without risk factors for diabetes. Testing should be considered at a younger age or be carried out more frequently if you are overweight and have at least 1 risk factor for diabetes.  Breast cancer screening is essential preventative care for women. You should practice "breast self-awareness." This means understanding the normal appearance and feel of your breasts and may include breast self-examination. Any changes detected, no matter how small, should be reported to a caregiver. Women in their 66s and 30s should have a clinical breast exam (CBE) by a caregiver as part of a regular health exam every 1 to 3 years. After age 101, women should have a CBE every year. Starting at age 100, women should consider having a mammogram (breast X-ray) every year. Women who have a family history of breast cancer should talk to their caregiver about genetic screening. Women at a high risk of breast cancer should talk to their caregiver about having an MRI and a mammogram every year.  Breast cancer gene (BRCA)-related cancer risk assessment is recommended for women who have family members with BRCA-related cancers. BRCA-related cancers include breast, ovarian, tubal, and peritoneal cancers. Having family members with these cancers may be associated with an increased risk for harmful changes (mutations) in the breast cancer genes BRCA1 and BRCA2. Results of the assessment will determine the need for genetic counseling and BRCA1 and BRCA2 testing.  The Pap test is a screening test for cervical cancer. Women should have a Pap test starting at age 57. Between ages 25 and 35, Pap tests should be repeated every 2 years. Beginning at age 37, you should have a Pap test every 3 years as long as the past 3 Pap tests have been normal. If you had a hysterectomy for a problem that was not cancer or a condition that could lead to cancer, then you no longer need Pap tests. If you are  between ages 50 and 76, and you have had normal Pap tests going back 10 years, you no longer need Pap tests. If you have had past treatment for cervical cancer or a condition that could lead to cancer, you need Pap tests and screening for cancer for at least 20 years after your treatment. If Pap tests have been discontinued, risk factors (such as a new sexual partner) need to be reassessed to determine if screening should be resumed. Some women have medical problems that increase the chance of getting cervical cancer. In these cases, your caregiver may recommend more frequent screening and Pap tests.  The human papillomavirus (HPV) test is an additional test that may be used for cervical cancer screening. The HPV test looks for the virus that can cause the cell changes on the cervix. The cells collected during the Pap test can be tested for HPV. The HPV test could be used to screen women aged 44 years and older, and should be used in women of any age  who have unclear Pap test results. After the age of 55, women should have HPV testing at the same frequency as a Pap test.  Colorectal cancer can be detected and often prevented. Most routine colorectal cancer screening begins at the age of 44 and continues through age 20. However, your caregiver may recommend screening at an earlier age if you have risk factors for colon cancer. On a yearly basis, your caregiver may provide home test kits to check for hidden blood in the stool. Use of a small camera at the end of a tube, to directly examine the colon (sigmoidoscopy or colonoscopy), can detect the earliest forms of colorectal cancer. Talk to your caregiver about this at age 86, when routine screening begins. Direct examination of the colon should be repeated every 5 to 10 years through age 13, unless early forms of pre-cancerous polyps or small growths are found.  Hepatitis C blood testing is recommended for all people born from [birth year] through 1966 and any  individual with known risks for hepatitis C.  Practice safe sex. Use condoms and avoid high-risk sexual practices to reduce the spread of sexually transmitted infections (STIs). Sexually active women aged 36 and younger should be checked for Chlamydia, which is a common sexually transmitted infection. Older women with new or multiple partners should also be tested for Chlamydia. Testing for other STIs is recommended if you are sexually active and at increased risk.  Osteoporosis is a disease in which the bones lose minerals and strength with aging. This can result in serious bone fractures. The risk of osteoporosis can be identified using a bone density scan. Women ages 20 and over and women at risk for fractures or osteoporosis should discuss screening with their caregivers. Ask your caregiver whether you should be taking a calcium supplement or vitamin D to reduce the rate of osteoporosis.  Menopause can be associated with physical symptoms and risks. Hormone replacement therapy is available to decrease symptoms and risks. You should talk to your caregiver about whether hormone replacement therapy is right for you.  Use sunscreen. Apply sunscreen liberally and repeatedly throughout the day. You should seek shade when your shadow is shorter than you. Protect yourself by wearing long sleeves, pants, a wide-brimmed hat, and sunglasses year round, whenever you are outdoors.  Notify your caregiver of new moles or changes in moles, especially if there is a change in shape or color. Also notify your caregiver if a mole is larger than the size of a pencil eraser.  Stay current with your immunizations. Document Released: 09/14/2010 Document Revised: 06/26/2012 Document Reviewed: 09/14/2010 [facility] Patient Information 2014 [employer].

## 2014-03-13 NOTE — Progress Notes (Signed)
Evelyn Thomas 09-Jul-1980 [id number]        33 y.o.  [id number] for annual exam.  Several issues noted below.  Past medical history,surgical history, problem list, medications, allergies, family history and social history were all reviewed and documented as reviewed in the EPIC chart.  ROS:  Performed with pertinent positives and negatives included in the history, assessment and plan.   Additional significant findings :  none   Exam: Delena ServeKim Alexis assistant Filed Vitals:   03/13/14 1625  BP: 114/70  Height: 5\' 4"  (1.626 m)  Weight: 175 lb (79.379 kg)   General appearance:  Normal affect, orientation and appearance. Skin: Grossly normal HEENT: Without gross lesions.  No cervical or supraclavicular adenopathy. Thyroid normal.  Lungs:  Clear without wheezing, rales or rhonchi Cardiac: RR, without RMG Abdominal:  Soft, nontender, without masses, guarding, rebound, organomegaly or hernia Breasts:  Examined lying and sitting without masses, retractions, discharge or axillary adenopathy. Pelvic:  Ext/BUS/vagina normal  Cervix normal  Uterus anteverted, normal size, shape and contour, midline and mobile nontender   Adnexa  Without masses or tenderness    Anus and perineum  Normal   Rectovaginal  Normal sphincter tone without palpated masses or tenderness.    Assessment/Plan:  33 y.o. [id number] female for annual exam with mildly irregular menses, condom contraception.   1. Irregular menses. Patient's menses occur each month but they will various far as coming 1 week earlier or one week later. Last 5 days. No significant cramping. Had Mirena IUD last year which was removed due to irregular bleeding. Will check baseline labs to include CBC, FSH TSH prolactin. Options for management to include observation, oral contraceptives with every other month to every third month withdrawal option and retry of Mirena IUD. She did have a Mirena IUD and did well. Had this replaced after 6 years with spontaneous  expulsion and then had it replaced one more time and had irregular bleeding which ultimately led to removal of her IUD after 6 months. Patient wants to think of her options and will follow up when her blood results return. 2. Contraception. Using condoms now which is acceptable to her. Increased failure risk reviewed. Options for more reliable contraception as noted above discussed. At this point patients comfortable continuing condoms accepting the increased failure risk. Plan be availability. 3. Breast health. SBE monthly reviewed. Screening mammographic recommendations between 35 and 40 reviewed. Does have postmenopausal maternal grandmother history of breast cancer. 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 She is leaning towards scheduling a screening mammogram at age 33 and will decide if she wants to do this. 4. Pap/HPV negative 2014. No Pap smear done today. No history of significant abnormal Pap smears previously. 5. Health maintenance. Baseline CBC, comprehensive metabolic panel lipid profile urinalysis ordered with above blood work. Follow up for results otherwise follow up with decision as far as irregular menses/contraceptive management.     Dara LordsFONTAINE,Kymia Simi P MD, 4:49 PM 03/13/2014

## 2014-03-14 LAB — URINALYSIS W MICROSCOPIC + REFLEX CULTURE
Bacteria, UA: NONE SEEN
Casts: NONE SEEN
Crystals: NONE SEEN
GLUCOSE, UA: NEGATIVE mg/dL
Ketones, ur: NEGATIVE mg/dL
Leukocytes, UA: NEGATIVE
Nitrite: NEGATIVE
Protein, ur: 100 mg/dL — AB
RBC / HPF: >50 RBC/hpf — AB (ref <3)
UROBILINOGEN UA: 0.2 mg/dL (ref 0.0–1.0)
pH: 6 (ref 5.0–8.0)

## 2014-03-14 LAB — TSH: TSH: 0.741 u[IU]/mL (ref 0.350–4.500)

## 2014-03-14 LAB — PROLACTIN: Prolactin: 84.3 ng/mL

## 2014-03-14 LAB — FOLLICLE STIMULATING HORMONE: FSH: 4.4 m[IU]/mL

## 2014-03-15 LAB — URINE CULTURE: Colony Count: >=100000

## 2014-03-19 ENCOUNTER — Encounter: Payer: BC Managed Care – PPO | Admitting: Gynecology

## 2014-03-29 ENCOUNTER — Encounter: Payer: Self-pay | Admitting: Gynecology

## 2014-04-02 ENCOUNTER — Telehealth: Payer: Self-pay

## 2014-04-02 ENCOUNTER — Telehealth: Payer: Self-pay | Admitting: *Deleted

## 2014-04-02 NOTE — Telephone Encounter (Signed)
error 

## 2014-04-02 NOTE — Telephone Encounter (Signed)
Urinalysis is contaminated do show some blood and bacteria. They did not do a culture. Recommend repeat clean catch urinalysis that I would prefer to have it done here because will follow up with culture if it's abnormal. Her cholesterol is mildly elevated at 209. Her LDL is also in the 120 range which is elevated. Increasing exercise/decreasing fat in the diet will help with this. Lastly her prolactin is upper limits of normal. My recommendation would be to repeat a fasting prolactin in several months. More than likely it'll be normal.  I had also ordered a CBC and do not see where they did that either.

## 2014-04-02 NOTE — Telephone Encounter (Signed)
Patient said she got a call that all the labs she needed did not get drawn at Dr. Aggie CosierBliss's office. (another order faxed).  However, she would like to know what the results of the ones that were drawn showed?

## 2014-04-02 NOTE — Telephone Encounter (Signed)
okay

## 2014-04-02 NOTE — Telephone Encounter (Signed)
Patient advised. She said there is absolutely no way she can come here for U/A as [place] and cannot come up here.  She asked if we can order this with Dr. Quillian QuinceBliss and include that if u/a shows blood or bacteria to forward it for culture.  (I will refax orders to include CBC and u/a if ok).  Ok?

## 2014-04-02 NOTE — Telephone Encounter (Signed)
I need diagnosis for lab order for repeating CBC per your note below.

## 2014-04-02 NOTE — Telephone Encounter (Signed)
[facility]

## 2014-04-02 NOTE — Telephone Encounter (Signed)
-----   Message from Jerilynn Mageslaudia Shaffer sent at 04/01/2014  9:16 AM EST ----- Regarding: lab order Can you send TSH and FSH order to [place] to have labs drawn there. Thx

## 2014-04-03 NOTE — Telephone Encounter (Signed)
Irregular menses

## 2014-04-12 ENCOUNTER — Encounter: Payer: Self-pay | Admitting: Gynecology

## 2014-04-17 ENCOUNTER — Telehealth: Payer: Self-pay | Admitting: *Deleted

## 2014-04-17 NOTE — Telephone Encounter (Signed)
The FSH TSH CBC were normal. Her prolactin was minimally elevated at 24 with 23 being the upper limits of normal. I don't think that that is of significance.  We talked about several options to include trial of Mirena IUD again versus low-dose oral contraceptives. I would recommend repeating her prolactin level as a fasting just to make sure that it remains normal/minimally elevated in 1-2 months.

## 2014-04-17 NOTE — Telephone Encounter (Signed)
Pt had TSH and FSH level labs done at PCP office, they are scanned in.  Just wanted to make sure you are aware they have returned. Please advise

## 2014-04-18 NOTE — Telephone Encounter (Signed)
Pt informed with the below note. Pt will have prolactin level checked at PCP office will call back to get order.

## 2014-08-19 ENCOUNTER — Ambulatory Visit (INDEPENDENT_AMBULATORY_CARE_PROVIDER_SITE_OTHER): Payer: Managed Care, Other (non HMO) | Admitting: Gynecology

## 2014-08-19 ENCOUNTER — Encounter: Payer: Self-pay | Admitting: Gynecology

## 2014-08-19 ENCOUNTER — Telehealth: Payer: Self-pay | Admitting: *Deleted

## 2014-08-19 DIAGNOSIS — N930 Postcoital and contact bleeding: Secondary | ICD-10-CM | POA: Diagnosis not present

## 2014-08-19 DIAGNOSIS — N926 Irregular menstruation, unspecified: Secondary | ICD-10-CM | POA: Diagnosis not present

## 2014-08-19 MED ORDER — IBUPROFEN 800 MG PO TABS: 800.0000 mg | ORAL_TABLET | Freq: Three times a day (TID) | ORAL | Status: AC | PRN

## 2014-08-19 MED ORDER — IBUPROFEN 800 MG PO TABS: 800.0000 mg | ORAL_TABLET | Freq: Three times a day (TID) | ORAL | Status: DC | PRN

## 2014-08-19 NOTE — Telephone Encounter (Signed)
PT WAS SEEN TODAY FOR IRREGULAR BLEEDING FORGOT TO ASK IF MOTRIN 800 MG CAN BE REFILLED WITH 2 REFILLS. PT SAID SHE TAKES FOR CRAMPS DURING CYCLE. PLEASE ADVISE

## 2014-08-19 NOTE — Progress Notes (Signed)
Evelyn Thomas [birth year] [id number]        34 y.o.  [id number] Presents complaining of continued ear regular menses and an episode of heavy postcoital bleeding.  Was evaluated at her annual exam 02/2014 noting some mild menstrual irregularity were her cycles come monthly but they vary as to when they occur. FSH TSH were normal. Prolactin was minimally elevated at 24. This mild menstrual irregularity has continued but most recently has had several episodes of post coital spotting the last time was fairly heavy requiring her husband to shower off afterwards. No pain or cramping or other symptoms.  Past medical history,surgical history, problem list, medications, allergies, family history and social history were all reviewed and documented in the EPIC chart.  Directed ROS with pertinent positives and negatives documented in the history of present illness/assessment and plan.  Exam: Victorino DikeJennifer assistant There were no vitals filed for this visit. General appearance:  Normal Abdomen soft nontender without masses guarding rebound Pelvic external BUS vagina normal. Cervix normal. Uterus normal size midline mobile nontender. Adnexa without masses or tenderness.  Assessment/Plan:  34 y.o. [id number] with mild menstrual irregularity. Recheck prolactin level today. Also with episodes of postcoital spotting. Start with sonohysterogram to rule out nonpalpable abnormalities. Options for management including hormonal regulation such as low-dose oral contraceptives discussed. She is interested may be retrying Mirena IUD. She had one and did great. She had a second one placed spontaneously extruded. A third placed and had irregular bleeding on and off for 6 months leading to its removal.  Currently using condoms for contraception. Patient will follow up with sonohysterogram and prolactin results and then we'll go from there.    Dara LordsFONTAINE,Evelyn Person P MD, 11:07 AM 08/19/2014

## 2014-08-19 NOTE — Patient Instructions (Signed)
Follow up for ultrasound as scheduled 

## 2014-08-19 NOTE — Telephone Encounter (Signed)
Okay for ibuprofen 800 mg #60 one by mouth every 8 hours when necessary pain with 2 refills

## 2014-08-20 LAB — PROLACTIN: Prolactin: 12.4 ng/mL

## 2014-08-20 NOTE — Telephone Encounter (Signed)
rx sent, pt aware 

## 2014-08-23 ENCOUNTER — Other Ambulatory Visit: Payer: Self-pay | Admitting: Gynecology

## 2014-08-23 DIAGNOSIS — N939 Abnormal uterine and vaginal bleeding, unspecified: Secondary | ICD-10-CM

## 2014-09-05 ENCOUNTER — Telehealth: Payer: Self-pay | Admitting: *Deleted

## 2014-09-05 ENCOUNTER — Telehealth: Payer: Self-pay | Admitting: Gynecology

## 2014-09-05 NOTE — Telephone Encounter (Signed)
The sonohysterogram was to rule out polyps or other pathology causing her irregular bleeding. It would be better for [place] to rule these out before the Mirena IUD. I would be willing to put the IUD in at the same time as the ultrasound assuming the ultrasound was negative for defects.

## 2014-09-05 NOTE — Telephone Encounter (Signed)
09/05/14-Pt was advised that her [employer] ins will cover the MIrena and insertion for contraception at 100%, no copay.wl

## 2014-09-05 NOTE — Telephone Encounter (Signed)
Pt was scheduled for [facility] appointment on 09/18/14 due to the cost. Pt asked if Mirena IUD could be placed any way? Please advise

## 2014-09-06 NOTE — Telephone Encounter (Signed)
Pt informed with the below note, states she will have to wait on [facility] until she gets better insurance.

## 2014-09-10 ENCOUNTER — Telehealth: Payer: Self-pay | Admitting: *Deleted

## 2014-09-10 DIAGNOSIS — N938 Other specified abnormal uterine and vaginal bleeding: Secondary | ICD-10-CM

## 2014-09-10 NOTE — Telephone Encounter (Signed)
Okay to schedule sonohysterogram. Okay if she is still bleeding when test is done. Recommend UPT in office the day she does the sonohysterogram if it is not immediately after a regular menses.

## 2014-09-10 NOTE — Telephone Encounter (Signed)
This is follow up from telephone encounter 09/05/14. Pt said she is still spotting has been doing a lot of thinking and [facility] because she is "sick of bleeding". Wants to know if okay if she is still bleeding to procedure with [facility]. Please advise

## 2014-09-11 NOTE — Telephone Encounter (Signed)
Pt aware of the below note, order will be placed for UPT.

## 2014-09-17 ENCOUNTER — Ambulatory Visit: Payer: Managed Care, Other (non HMO) | Admitting: Gynecology

## 2014-09-17 ENCOUNTER — Other Ambulatory Visit: Payer: Managed Care, Other (non HMO)

## 2014-09-17 ENCOUNTER — Telehealth: Payer: Self-pay | Admitting: Gynecology

## 2014-09-17 NOTE — Telephone Encounter (Signed)
09/17/14-I called pt ref her sonohysterogram benefits with her [employer] ins. I have called them 2 times and got different information. Today I was told she only has a $15.00 copay but when I called last week I was told she had a $15 copay and the 2 ultrasound codes would go towards her deductible. I told her we would take the $15 copay day of test and see how insurance responds. She does realize she may be responsible for more money.wl

## 2014-09-18 ENCOUNTER — Ambulatory Visit: Payer: Managed Care, Other (non HMO) | Admitting: Gynecology

## 2014-09-18 ENCOUNTER — Other Ambulatory Visit: Payer: Managed Care, Other (non HMO)

## 2014-09-24 ENCOUNTER — Encounter: Payer: Self-pay | Admitting: Gynecology

## 2014-09-24 ENCOUNTER — Ambulatory Visit (INDEPENDENT_AMBULATORY_CARE_PROVIDER_SITE_OTHER): Payer: Managed Care, Other (non HMO)

## 2014-09-24 ENCOUNTER — Ambulatory Visit (INDEPENDENT_AMBULATORY_CARE_PROVIDER_SITE_OTHER): Payer: Managed Care, Other (non HMO) | Admitting: Gynecology

## 2014-09-24 ENCOUNTER — Other Ambulatory Visit: Payer: Self-pay | Admitting: Gynecology

## 2014-09-24 VITALS — BP 120/76

## 2014-09-24 DIAGNOSIS — N939 Abnormal uterine and vaginal bleeding, unspecified: Secondary | ICD-10-CM

## 2014-09-24 DIAGNOSIS — N831 Corpus luteum cyst of ovary, unspecified side: Secondary | ICD-10-CM

## 2014-09-24 DIAGNOSIS — N926 Irregular menstruation, unspecified: Secondary | ICD-10-CM | POA: Diagnosis not present

## 2014-09-24 NOTE — Progress Notes (Addendum)
Stephan Ministeronya E Christenson 06/18/1980 [id number]        34 y.o.  [id number] presents for sonohysterogram due to continued irregular bleeding. Also is for a IUD placement.  Unfortunately she had intercourse last night. They did use a condom and she consistently does use condoms  Past medical history,surgical history, problem list, medications, allergies, family history and social history were all reviewed and documented in the EPIC chart.  Directed ROS with pertinent positives and negatives documented in the history of present illness/assessment and plan.  Exam: Filed Vitals:   09/24/14 1635  BP: 120/76   General appearance:  Normal  Ultrasound shows uterus normal size and echotexture. Endometrium 7.4 mm. Right and left ovaries are normal noting left ovarian corpus luteal cyst 29 x 17 mm. Cul-de-sac negative.   Sonohysterogram was canceled due to her recent intercourse.  Assessment/Plan:  [age] y.o. [id number] reviewed normal ultrasound with the patient. Endometrial echo is well visualized and appears normal/try layered. She is mid cycle now. I did not do the sonohysterogram given the intercourse last night even though they used condoms I felt uncomfortable doing this. I did recommend that if she wants to proceed with the IUD that she abstain for 2 weeks from intercourse and then she'll represent at that point after a recheck of an hCG if she does not start a normal menses. If she does start a normal menses flow then she can come at that point to have the IUD placed. Patient's comfortable with this decision. I'm not going to reschedule the sonohysterogram given that I did get a clear visualization of the endometrium. She does understand that we could miss small polyps or other pathology.    Dara LordsFONTAINE,Daysha Ashmore P MD, 4:53 PM 09/24/2014

## 2014-09-24 NOTE — Patient Instructions (Addendum)
Abstain from intercourse. Follow up in 2-3 weeks for IUD placement. If a full period starts then you can come in during that time to have the IUD placed.

## 2014-09-25 LAB — PREGNANCY, URINE: PREG TEST UR: NEGATIVE

## 2014-09-25 NOTE — Addendum Note (Signed)
Addended by: Dayna BarkerGARDNER, Krysia Zahradnik K on: 09/25/2014 09:53 AM   Modules accepted: Orders

## 2014-10-08 ENCOUNTER — Ambulatory Visit: Payer: Managed Care, Other (non HMO) | Admitting: Gynecology

## 2015-06-18 ENCOUNTER — Ambulatory Visit
Admission: RE | Admit: 2015-06-18 | Discharge: 2015-06-18 | Disposition: A | Discharge status: Home / Self Care | Payer: BLUE CROSS/BLUE SHIELD | Source: Ambulatory Visit | Attending: Family Medicine | Admitting: Family Medicine

## 2015-06-18 ENCOUNTER — Other Ambulatory Visit: Payer: Self-pay | Admitting: Family Medicine

## 2015-06-18 DIAGNOSIS — R05 Cough: Secondary | ICD-10-CM | POA: Insufficient documentation

## 2015-06-18 DIAGNOSIS — R059 Cough, unspecified: Secondary | ICD-10-CM

## 2015-06-18 DIAGNOSIS — J069 Acute upper respiratory infection, unspecified: Secondary | ICD-10-CM

## 2016-01-06 ENCOUNTER — Encounter: Payer: Self-pay | Admitting: *Deleted

## 2016-01-07 ENCOUNTER — Encounter: Payer: Self-pay | Admitting: General Surgery

## 2016-01-07 ENCOUNTER — Ambulatory Visit (INDEPENDENT_AMBULATORY_CARE_PROVIDER_SITE_OTHER): Payer: BLUE CROSS/BLUE SHIELD | Admitting: General Surgery

## 2016-01-07 VITALS — BP 98/62 | HR 80 | Resp 12 | Ht 64.0 in | Wt 178.0 lb

## 2016-01-07 DIAGNOSIS — R202 Paresthesia of skin: Secondary | ICD-10-CM

## 2016-01-07 DIAGNOSIS — M7989 Other specified soft tissue disorders: Secondary | ICD-10-CM

## 2016-01-07 NOTE — Patient Instructions (Signed)
The patient is aware to call back for any questions or concerns.  

## 2016-01-07 NOTE — Progress Notes (Addendum)
Patient ID: Evelyn Ministeronya E Vest, female   DOB: [birth year], 35 y.o.   MRN: [id number]  Chief Complaint  Patient presents with  . Other    leg     HPI Evelyn Thomas is a 35 y.o. female here today for a evaluation of bilateral leg pain. She states the legs are feeling heavy and tingling. [place] lot of walking and her legs where swollen. Since then she has not noted any swelling but shill c/o some tingling all along both lower limbs. Also has noted some mild back discomfort.. No chest pain or trouble breathing. I have reviewed the history of present illness with the patient.  HPI  Past Medical History:  Diagnosis Date  . IUD complication (HCC) 02/2013   Mirena IUD removed after 6 months of bleeding on and off    Past Surgical History:  Procedure Laterality Date  . eardrum surgery    . TONSILLECTOMY    . TYMPANOSTOMY TUBE PLACEMENT      Family History  Problem Relation Age of Onset  . Hypertension Father   . Diabetes Maternal Grandmother   . Breast cancer Maternal Grandmother     Late 4450's  . Diabetes Paternal Grandmother   . Dementia Paternal Grandmother     Social History Social History  Substance Use Topics  . Smoking status: Never Smoker  . Smokeless tobacco: Not on file  . Alcohol use 0.0 oz/week     Comment: Rare    No Known Allergies  Current Outpatient Prescriptions  Medication Sig Dispense Refill  . ibuprofen (ADVIL,MOTRIN) 800 MG tablet Take 1 tablet (800 mg total) by mouth every 8 (eight) hours as needed. 60 tablet 2   Current Facility-Administered Medications  Medication Dose Route Frequency Provider Last Rate Last Dose  . levonorgestrel (MIRENA) 20 MCG/24HR IUD   Intrauterine Once Dara Lordsimothy P Fontaine, MD        Review of Systems Review of Systems  Respiratory: Negative.   Cardiovascular: Negative.     Blood pressure 98/62, pulse 80, resp. rate 12, height 5\' 4"  (1.626 m), weight 178 lb (80.7 kg).  Physical Exam Physical Exam   Constitutional: She is oriented to person, place, and time. She appears well-developed and well-nourished.  HENT:  Mouth/Throat: Oropharynx is clear and moist.  Eyes: Conjunctivae are normal. No scleral icterus.  Neck: Neck supple.  Cardiovascular: Normal rate, regular rhythm and normal heart sounds.   Pulses:      Femoral pulses are 2+ on the right side, and 2+ on the left side.      Dorsalis pedis pulses are 2+ on the right side, and 2+ on the left side.  No lower leg edema, no varicose veins.  Pulmonary/Chest: Effort normal and breath sounds normal.  Abdominal: Soft. Bowel sounds are normal.  Lymphadenopathy:    She has no cervical adenopathy.  Neurological: She is alert and oriented to person, place, and time.  Skin: Skin is warm and dry.  Psychiatric: Her behavior is normal.    Data Reviewed    Assessment    Paresthesias lower limbs. This is not due to vascular source. Possible this is from her back.   If symptoms persist or worsen she should consult her PCP  Plan    Monitor symptoms, call for any new or worsening symptoms or concerns.     This information has been scribed by Ples SpecterJessica Qualls CMA.   Gianpaolo Mindel G 01/07/2016, 12:39 PM

## 2017-09-19 ENCOUNTER — Emergency Department: Payer: No Typology Code available for payment source

## 2017-09-19 ENCOUNTER — Emergency Department
Admission: EM | Admit: 2017-09-19 | Discharge: 2017-09-19 | Disposition: A | Discharge status: Home / Self Care | Payer: No Typology Code available for payment source | Attending: Emergency Medicine | Admitting: Emergency Medicine

## 2017-09-19 DIAGNOSIS — Z79899 Other long term (current) drug therapy: Secondary | ICD-10-CM | POA: Diagnosis not present

## 2017-09-19 DIAGNOSIS — Y92410 Unspecified street and highway as the place of occurrence of the external cause: Secondary | ICD-10-CM | POA: Insufficient documentation

## 2017-09-19 DIAGNOSIS — M7918 Myalgia, other site: Secondary | ICD-10-CM | POA: Diagnosis present

## 2017-09-19 DIAGNOSIS — Y9389 Activity, other specified: Secondary | ICD-10-CM | POA: Diagnosis not present

## 2017-09-19 DIAGNOSIS — Y999 Unspecified external cause status: Secondary | ICD-10-CM | POA: Diagnosis not present

## 2017-09-19 LAB — URINALYSIS, COMPLETE (UACMP) WITH MICROSCOPIC
Bilirubin Urine: NEGATIVE
Glucose, UA: NEGATIVE mg/dL
Ketones, ur: NEGATIVE mg/dL
Leukocytes, UA: NEGATIVE
Nitrite: NEGATIVE
Protein, ur: NEGATIVE mg/dL
Specific Gravity, Urine: 1.018 (ref 1.005–1.030)
pH: 6 (ref 5.0–8.0)

## 2017-09-19 LAB — POCT PREGNANCY, URINE: Preg Test, Ur: NEGATIVE

## 2017-09-19 MED ORDER — KETOROLAC TROMETHAMINE 30 MG/ML IJ SOLN: 30.0000 mg | Freq: Once | INTRAMUSCULAR | Status: DC

## 2017-09-19 MED ORDER — IBUPROFEN 800 MG PO TABS
800.0000 mg | ORAL_TABLET | Freq: Once | ORAL | Status: AC
  Administered 2017-09-19: 800 mg via ORAL
  Filled 2017-09-19: qty 1

## 2017-09-19 MED ORDER — MELOXICAM 15 MG PO TABS: 15.0000 mg | ORAL_TABLET | Freq: Every day | ORAL | 1 refills | Status: AC

## 2017-09-19 MED ORDER — CYCLOBENZAPRINE HCL 10 MG PO TABS: 10.0000 mg | ORAL_TABLET | Freq: Three times a day (TID) | ORAL | 0 refills | Status: AC | PRN
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
